# Patient Record
Sex: Female | Born: 1960 | Race: White | Hispanic: No | Marital: Married
Health system: Southern US, Community
[De-identification: ages and names within clinical notes are randomized; demographics above are authoritative.]

## PROBLEM LIST (undated history)

## (undated) DIAGNOSIS — F419 Anxiety disorder, unspecified: Secondary | ICD-10-CM

## (undated) DIAGNOSIS — I10 Essential (primary) hypertension: Secondary | ICD-10-CM

## (undated) DIAGNOSIS — E079 Disorder of thyroid, unspecified: Secondary | ICD-10-CM

## (undated) DIAGNOSIS — Z9889 Other specified postprocedural states: Secondary | ICD-10-CM

## (undated) DIAGNOSIS — K219 Gastro-esophageal reflux disease without esophagitis: Secondary | ICD-10-CM

## (undated) DIAGNOSIS — F32A Depression, unspecified: Secondary | ICD-10-CM

## (undated) DIAGNOSIS — F4310 Post-traumatic stress disorder, unspecified: Secondary | ICD-10-CM

## (undated) HISTORY — DX: Depression, unspecified: F32.A

## (undated) HISTORY — DX: Disorder of thyroid, unspecified: E07.9

## (undated) HISTORY — DX: Other specified postprocedural states: Z98.890

## (undated) HISTORY — DX: Anxiety disorder, unspecified: F41.9

## (undated) HISTORY — DX: Essential (primary) hypertension: I10

## (undated) HISTORY — PX: REDUCTION MAMMAPLASTY: SUR839

## (undated) HISTORY — DX: Gastro-esophageal reflux disease without esophagitis: K21.9

## (undated) HISTORY — DX: Post-traumatic stress disorder, unspecified: F43.10

## (undated) HISTORY — PX: CHOLECYSTECTOMY: SHX55

---

## 2000-01-25 ENCOUNTER — Other Ambulatory Visit: Admission: RE | Admit: 2000-01-25 | Discharge: 2000-01-25 | Payer: Self-pay | Admitting: *Deleted

## 2002-02-22 ENCOUNTER — Encounter: Payer: Self-pay | Admitting: Family Medicine

## 2002-02-22 ENCOUNTER — Encounter: Admission: RE | Admit: 2002-02-22 | Discharge: 2002-02-22 | Payer: Self-pay | Admitting: Family Medicine

## 2002-07-28 ENCOUNTER — Ambulatory Visit (HOSPITAL_COMMUNITY): Admission: RE | Admit: 2002-07-28 | Discharge: 2002-07-28 | Payer: Self-pay | Admitting: Family Medicine

## 2002-07-28 ENCOUNTER — Encounter: Payer: Self-pay | Admitting: Family Medicine

## 2004-03-08 ENCOUNTER — Other Ambulatory Visit: Admission: RE | Admit: 2004-03-08 | Discharge: 2004-03-08 | Payer: Self-pay | Admitting: Family Medicine

## 2004-04-02 ENCOUNTER — Encounter: Admission: RE | Admit: 2004-04-02 | Discharge: 2004-04-02 | Payer: Self-pay | Admitting: Family Medicine

## 2005-05-13 ENCOUNTER — Other Ambulatory Visit: Admission: RE | Admit: 2005-05-13 | Discharge: 2005-05-13 | Payer: Self-pay | Admitting: Family Medicine

## 2006-01-26 ENCOUNTER — Emergency Department (HOSPITAL_COMMUNITY): Admission: EM | Admit: 2006-01-26 | Discharge: 2006-01-26 | Payer: Self-pay | Admitting: Emergency Medicine

## 2006-06-28 ENCOUNTER — Other Ambulatory Visit: Admission: RE | Admit: 2006-06-28 | Discharge: 2006-06-28 | Payer: Self-pay | Admitting: Family Medicine

## 2007-08-07 ENCOUNTER — Encounter: Admission: RE | Admit: 2007-08-07 | Discharge: 2007-08-07 | Payer: Self-pay | Admitting: Family Medicine

## 2015-07-16 DIAGNOSIS — IMO0001 Reserved for inherently not codable concepts without codable children: Secondary | ICD-10-CM | POA: Insufficient documentation

## 2015-07-16 DIAGNOSIS — G4761 Periodic limb movement disorder: Secondary | ICD-10-CM | POA: Insufficient documentation

## 2015-07-16 DIAGNOSIS — G250 Essential tremor: Secondary | ICD-10-CM | POA: Insufficient documentation

## 2016-01-13 DIAGNOSIS — S069X9A Unspecified intracranial injury with loss of consciousness of unspecified duration, initial encounter: Secondary | ICD-10-CM | POA: Insufficient documentation

## 2016-01-13 DIAGNOSIS — S069XAA Unspecified intracranial injury with loss of consciousness status unknown, initial encounter: Secondary | ICD-10-CM | POA: Insufficient documentation

## 2017-01-26 DIAGNOSIS — I10 Essential (primary) hypertension: Secondary | ICD-10-CM | POA: Insufficient documentation

## 2017-06-02 DIAGNOSIS — F3341 Major depressive disorder, recurrent, in partial remission: Secondary | ICD-10-CM | POA: Insufficient documentation

## 2017-06-02 DIAGNOSIS — F339 Major depressive disorder, recurrent, unspecified: Secondary | ICD-10-CM | POA: Insufficient documentation

## 2017-06-02 DIAGNOSIS — G3184 Mild cognitive impairment, so stated: Secondary | ICD-10-CM | POA: Insufficient documentation

## 2017-07-27 DIAGNOSIS — A6000 Herpesviral infection of urogenital system, unspecified: Secondary | ICD-10-CM | POA: Insufficient documentation

## 2017-07-27 DIAGNOSIS — K219 Gastro-esophageal reflux disease without esophagitis: Secondary | ICD-10-CM | POA: Insufficient documentation

## 2020-06-10 ENCOUNTER — Other Ambulatory Visit: Payer: Self-pay

## 2020-06-10 ENCOUNTER — Ambulatory Visit: Payer: 59 | Admitting: Family Medicine

## 2020-06-10 ENCOUNTER — Encounter: Payer: Self-pay | Admitting: Family Medicine

## 2020-06-10 VITALS — BP 120/74 | HR 89 | Temp 98.0°F | Ht 62.5 in | Wt 143.8 lb

## 2020-06-10 DIAGNOSIS — F419 Anxiety disorder, unspecified: Secondary | ICD-10-CM

## 2020-06-10 DIAGNOSIS — Z2821 Immunization not carried out because of patient refusal: Secondary | ICD-10-CM | POA: Diagnosis not present

## 2020-06-10 DIAGNOSIS — Z7689 Persons encountering health services in other specified circumstances: Secondary | ICD-10-CM | POA: Diagnosis not present

## 2020-06-10 DIAGNOSIS — E039 Hypothyroidism, unspecified: Secondary | ICD-10-CM | POA: Diagnosis not present

## 2020-06-10 DIAGNOSIS — A6000 Herpesviral infection of urogenital system, unspecified: Secondary | ICD-10-CM

## 2020-06-10 DIAGNOSIS — I10 Essential (primary) hypertension: Secondary | ICD-10-CM

## 2020-06-10 DIAGNOSIS — F32A Depression, unspecified: Secondary | ICD-10-CM

## 2020-06-10 DIAGNOSIS — K649 Unspecified hemorrhoids: Secondary | ICD-10-CM

## 2020-06-10 DIAGNOSIS — K219 Gastro-esophageal reflux disease without esophagitis: Secondary | ICD-10-CM

## 2020-06-10 MED ORDER — VALACYCLOVIR HCL 1 G PO TABS: 1000.0000 mg | ORAL_TABLET | Freq: Every day | ORAL | 3 refills | Status: DC

## 2020-06-10 MED ORDER — BUPROPION HCL ER (XL) 450 MG PO TB24: 450.0000 mg | ORAL_TABLET | Freq: Every day | ORAL | 3 refills | Status: DC

## 2020-06-10 MED ORDER — VALSARTAN-HYDROCHLOROTHIAZIDE 80-12.5 MG PO TABS: 1.0000 | ORAL_TABLET | Freq: Every morning | ORAL | 3 refills | Status: DC

## 2020-06-10 MED ORDER — CITALOPRAM HYDROBROMIDE 20 MG PO TABS
20.0000 mg | ORAL_TABLET | Freq: Every day | ORAL | 3 refills | Status: DC
Start: 2020-06-10 — End: 2021-09-27

## 2020-06-10 MED ORDER — DEXILANT 60 MG PO CPDR: 60.0000 mg | DELAYED_RELEASE_CAPSULE | Freq: Every day | ORAL | 3 refills | Status: DC

## 2020-06-10 MED ORDER — SPIRONOLACTONE 25 MG PO TABS: 25.0000 mg | ORAL_TABLET | Freq: Every day | ORAL | 3 refills | Status: DC

## 2020-06-10 MED ORDER — CLONAZEPAM 0.5 MG PO TABS: 0.5000 mg | ORAL_TABLET | Freq: Two times a day (BID) | ORAL | 2 refills | Status: DC

## 2020-06-10 MED ORDER — LIOTHYRONINE SODIUM 5 MCG PO TABS: 10.0000 ug | ORAL_TABLET | Freq: Every day | ORAL | 3 refills | Status: DC

## 2020-06-10 NOTE — Progress Notes (Signed)
Erika Campbell is a 60 y.o. female  Chief Complaint  Patient presents with  . Establish Care    NP- establish care.      HPI: Erika Campbell is a 60 y.o. female here to establish care with our office. Her previous PCP was with [facility]. She is a retired P[employer] consultant.  She has HTN and is on valsartan/HCTZ 80-12.5mg  daily.  She has a h/o hypothyroidism and is on cytomel 10mg  (5mg  2 tabs) daily.  She has a h/o anxiety and depression and is on celexa 20mg  daily, wellbutrin XL 450mg  daily, and klonopin 0.5mg  qHS. She has GERD for which she takes dexilant 60mg  daily.   She follows at [facility] x 2-3 years and they Rx progesterone and estradiol pellets. They also Rx her thyroid medication.   Specialists: psychiatrist - ; she also sees a counselor  Last labs: 07/2019 Last PAP: 07/2019 - normal (done with PCP) Last mammo: 08/2019 Last colonoscopy: 2018 or so - due in 10 years  Med refills needed today: yes  She needs a referral to [facility] for hemorrhoids and ? Rectocele.   Mother at [place] in skilled nursing and has a-fib. Pt goes daily to care for her.   Past Medical History:  Diagnosis Date  . Anxiety   . Depression   . GERD (gastroesophageal reflux disease)   . Hypertension   . Thyroid disease     Past Surgical History:  Procedure Laterality Date  . CHOLECYSTECTOMY      Social History   Socioeconomic History  . Marital status: Unknown    Spouse name: Not on file  . Number of children: Not on file  . Years of education: Not on file  . Highest education level: Not on file  Occupational History  . Not on file  Tobacco Use  . Smoking status: Never Smoker  . Smokeless tobacco: Never Used  Vaping Use  . Vaping Use: Never used  Substance and Sexual Activity  . Alcohol use: Yes  . Drug use: Never  . Sexual activity: Yes  Other Topics Concern  . Not on file  Social History Narrative   ** Merged History Encounter **       Social Determinants of Health    Financial Resource Strain: Not on file  Food Insecurity: Not on file  Transportation Needs: Not on file  Physical Activity: Not on file  Stress: Not on file  Social Connections: Not on file  Intimate Partner Violence: Not on file    Family History  Problem Relation Age of Onset  . Heart disease Mother   . Stroke Mother   . Dementia Mother   . Heart disease Father   . Diabetes Father   . Hypertension Father   . Hyperlipidemia Father   . Cancer Father        bladder, prostrate  . Tremor Father      Immunization History  Administered Date(s) Administered  . PFIZER SARS-COV-2 Vaccination 07/29/2019, 08/26/2019    Outpatient Encounter Medications as of 06/10/2020  Medication Sig  . buPROPion (WELLBUTRIN XL) 150 MG 24 hr tablet   . buPROPion (WELLBUTRIN XL) 300 MG 24 hr tablet   . citalopram (CELEXA) 20 MG tablet Take by mouth.  . clonazePAM (KLONOPIN) 0.5 MG tablet Take 1 tablet by mouth at bedtime.  2019 dexlansoprazole (DEXILANT) 60 MG capsule Take 60 mg by mouth daily.  . Estradiol 10 MCG INST Place vaginally.  Killen liothyronine (CYTOMEL) 5 MCG tablet Take  2 tablets by mouth daily.  . progesterone (PROMETRIUM) 100 MG capsule Take by mouth.  . senna-docusate (SENOKOT-S) 8.6-50 MG tablet Take 1 tablet by mouth daily.  Marland Kitchen spironolactone (ALDACTONE) 25 MG tablet   . valACYclovir (VALTREX) 1000 MG tablet Take 1 tablet by mouth daily.  . valsartan-hydrochlorothiazide (DIOVAN-HCT) 80-12.5 MG tablet Take 1 tablet by mouth every morning.   No facility-administered encounter medications on file as of 06/10/2020.     ROS: Pertinent positives and negatives noted in HPI. Remainder of ROS non-contributory    Allergies  Allergen Reactions  . Lexapro [Escitalopram]     rages  . Lisinopril     nausea  . Mirapex [Pramipexole]     paranoid    BP 120/74   Pulse 89   Temp 98 F (36.7 C) (Temporal)   Ht 5' 2.5" (1.588 m)   Wt 143 lb 12.8 oz (65.2 kg)   SpO2 96%   BMI 25.88  kg/m   Physical Exam Constitutional:      General: She is not in acute distress.    Appearance: Normal appearance. She is not ill-appearing.  Pulmonary:     Effort: No respiratory distress.  Neurological:     Mental Status: She is alert and oriented to person, place, and time.  Psychiatric:        Mood and Affect: Mood normal.        Behavior: Behavior normal.      A/P:  1. Encounter to establish care with new doctor  2. Benign essential HTN - controlled, at goal Refill: - valsartan-hydrochlorothiazide (DIOVAN-HCT) 80-12.5 MG tablet; Take 1 tablet by mouth every morning.  Dispense: 90 tablet; Refill: 3 - spironolactone (ALDACTONE) 25 MG tablet; Take 1 tablet (25 mg total) by mouth daily.  Dispense: 90 tablet; Refill: 3  3. Hypothyroidism, unspecified type - TSH WNL in 07/2019 Refill: - liothyronine (CYTOMEL) 5 MCG tablet; Take 2 tablets (10 mcg total) by mouth daily.  Dispense: 180 tablet; Refill: 3  4. Influenza vaccination declined by patient  5. Herpes zoster vaccination declined  6. Anxiety and depression - controlled, follows with psych; PTSD d/t first husband's shooting death Refill: - clonazePAM (KLONOPIN) 0.5 MG tablet; Take 1 tablet (0.5 mg total) by mouth 2 (two) times daily.  Dispense: 60 tablet; Refill: 2 - buPROPion 450 MG TB24; Take 450 mg by mouth daily.  Dispense: 90 tablet; Refill: 3 - citalopram (CELEXA) 20 MG tablet; Take 1 tablet (20 mg total) by mouth daily.  Dispense: 90 tablet; Refill: 3  7. Gastroesophageal reflux disease, unspecified whether esophagitis present - stable Refill: - dexlansoprazole (DEXILANT) 60 MG capsule; Take 1 capsule (60 mg total) by mouth daily.  Dispense: 90 capsule; Refill: 3  8. Genital herpes simplex, unspecified site - stable, controlled Refill: - valACYclovir (VALTREX) 1000 MG tablet; Take 1 tablet (1,000 mg total) by mouth daily.  Dispense: 90 tablet; Refill: 3  9. Hemorrhoids, unspecified hemorrhoid type -  Ambulatory referral to General Surgery    This visit occurred during the SARS-CoV-2 public health emergency.  Safety protocols were in place, including screening questions prior to the visit, additional usage of staff PPE, and extensive cleaning of exam room while observing appropriate contact time as indicated for disinfecting solutions.

## 2020-06-10 NOTE — Patient Instructions (Signed)
[facility]: [email or web address]  [facility] [email or web address]  [email or web address]   [facility] [email or web address]  [email or web address]  

## 2020-06-15 ENCOUNTER — Encounter: Payer: Self-pay | Admitting: Family Medicine

## 2020-06-17 ENCOUNTER — Encounter: Payer: Self-pay | Admitting: Family Medicine

## 2020-06-17 ENCOUNTER — Telehealth: Payer: Self-pay

## 2020-06-17 NOTE — Telephone Encounter (Signed)
PA forms for both the Bupropion and Dexilant filled out, singed, and faxed to [employer] @ [phone]..  Waiting on response.  Dm/cma

## 2020-06-18 NOTE — Telephone Encounter (Signed)
PA for the Dexlansoprazole is approved from 06/17/20 - 06/16/21. Patient notified VIA my chart message. Dm/cma

## 2020-07-07 ENCOUNTER — Encounter: Payer: Self-pay | Admitting: Family Medicine

## 2020-07-25 ENCOUNTER — Encounter: Payer: Self-pay | Admitting: Family Medicine

## 2020-07-25 DIAGNOSIS — F32A Depression, unspecified: Secondary | ICD-10-CM

## 2020-07-25 DIAGNOSIS — F419 Anxiety disorder, unspecified: Secondary | ICD-10-CM

## 2020-07-29 MED ORDER — CLONAZEPAM 0.5 MG PO TABS: 0.5000 mg | ORAL_TABLET | Freq: Two times a day (BID) | ORAL | 2 refills | Status: DC

## 2020-08-18 ENCOUNTER — Encounter: Payer: Self-pay | Admitting: Family Medicine

## 2020-08-18 DIAGNOSIS — F32A Depression, unspecified: Secondary | ICD-10-CM

## 2020-08-19 MED ORDER — BUPROPION HCL ER (XL) 300 MG PO TB24: 300.0000 mg | ORAL_TABLET | Freq: Every day | ORAL | 1 refills | Status: DC

## 2020-08-19 MED ORDER — BUPROPION HCL ER (XL) 150 MG PO TB24: 150.0000 mg | ORAL_TABLET | Freq: Every day | ORAL | 1 refills | Status: DC

## 2020-10-06 ENCOUNTER — Other Ambulatory Visit: Payer: Self-pay

## 2020-10-07 ENCOUNTER — Ambulatory Visit (INDEPENDENT_AMBULATORY_CARE_PROVIDER_SITE_OTHER): Payer: 59 | Admitting: Family Medicine

## 2020-10-07 ENCOUNTER — Encounter: Payer: Self-pay | Admitting: Family Medicine

## 2020-10-07 VITALS — BP 100/90 | HR 89 | Temp 98.2°F | Ht 62.75 in | Wt 148.8 lb

## 2020-10-07 DIAGNOSIS — Z1321 Encounter for screening for nutritional disorder: Secondary | ICD-10-CM | POA: Diagnosis not present

## 2020-10-07 DIAGNOSIS — I10 Essential (primary) hypertension: Secondary | ICD-10-CM

## 2020-10-07 DIAGNOSIS — Z13220 Encounter for screening for lipoid disorders: Secondary | ICD-10-CM | POA: Diagnosis not present

## 2020-10-07 DIAGNOSIS — Z0000 Encounter for general adult medical examination without abnormal findings: Secondary | ICD-10-CM | POA: Diagnosis not present

## 2020-10-07 DIAGNOSIS — F32A Depression, unspecified: Secondary | ICD-10-CM

## 2020-10-07 DIAGNOSIS — F419 Anxiety disorder, unspecified: Secondary | ICD-10-CM

## 2020-10-07 DIAGNOSIS — M778 Other enthesopathies, not elsewhere classified: Secondary | ICD-10-CM

## 2020-10-07 DIAGNOSIS — E039 Hypothyroidism, unspecified: Secondary | ICD-10-CM

## 2020-10-07 DIAGNOSIS — Z1159 Encounter for screening for other viral diseases: Secondary | ICD-10-CM

## 2020-10-07 DIAGNOSIS — Z1231 Encounter for screening mammogram for malignant neoplasm of breast: Secondary | ICD-10-CM

## 2020-10-07 DIAGNOSIS — Z114 Encounter for screening for human immunodeficiency virus [HIV]: Secondary | ICD-10-CM

## 2020-10-07 LAB — BASIC METABOLIC PANEL
BUN: 9 mg/dL (ref 6–23)
CO2: 28 mEq/L (ref 19–32)
Calcium: 8.9 mg/dL (ref 8.4–10.5)
Chloride: 102 mEq/L (ref 96–112)
Creatinine, Ser: 0.78 mg/dL (ref 0.40–1.20)
GFR: 81.75 mL/min (ref >60.00)
Glucose, Bld: 109 mg/dL — ABNORMAL HIGH (ref 70–99)
Potassium: 3.8 mEq/L (ref 3.5–5.1)
Sodium: 138 mEq/L (ref 135–145)

## 2020-10-07 LAB — LIPID PANEL
Cholesterol: 201 mg/dL — ABNORMAL HIGH (ref 0–200)
HDL: 76.1 mg/dL (ref >39.00)
LDL Cholesterol: 107 mg/dL — ABNORMAL HIGH (ref 0–99)
NonHDL: 125.35
Total CHOL/HDL Ratio: 3
Triglycerides: 91 mg/dL (ref 0.0–149.0)
VLDL: 18.2 mg/dL (ref 0.0–40.0)

## 2020-10-07 LAB — CBC
HCT: 39.6 % (ref 36.0–46.0)
Hemoglobin: 13.6 g/dL (ref 12.0–15.0)
MCHC: 34.3 g/dL (ref 30.0–36.0)
MCV: 94.6 fl (ref 78.0–100.0)
Platelets: 333 10*3/uL (ref 150.0–400.0)
RBC: 4.18 Mil/uL (ref 3.87–5.11)
RDW: 13.1 % (ref 11.5–15.5)
WBC: 6.6 10*3/uL (ref 4.0–10.5)

## 2020-10-07 LAB — ALT: ALT: 27 U/L (ref 0–35)

## 2020-10-07 LAB — VITAMIN D 25 HYDROXY (VIT D DEFICIENCY, FRACTURES): VITD: 22.07 ng/mL — ABNORMAL LOW (ref 30.00–100.00)

## 2020-10-07 LAB — AST: AST: 16 U/L (ref 0–37)

## 2020-10-07 LAB — T4, FREE: Free T4: 0.73 ng/dL (ref 0.60–1.60)

## 2020-10-07 LAB — TSH: TSH: 2.27 u[IU]/mL (ref 0.35–4.50)

## 2020-10-07 NOTE — Progress Notes (Signed)
Erika Campbell is a 60 y.o. female  Chief Complaint  Patient presents with  . Annual Exam    Pt is fasting for lab work, pt not UTD on mammo, colonoscopy 5 yrs ago, unsure about pap smear.  Pt c/o lt thumb pain x 43months.    HPI: Erika Campbell is a 60 y.o. female patient seen today for annual CPE, fasting labs.   Last PAP: 03/2019 - normal, no h/o abnormal PAP Last mammo: due - last in 07/2018 Last colonoscopy: 09/06/2017 - due in 08/2027  Diet/Exercise: overall healthy, well-balanced; reasonable active and walks 10,000 steps per day Dental: UTD Vision: has appt schedule  Med refills needed today? none  Pt complains of Lt thumb pain x 2 mo. Pain at base of thumb. No pain at rest, pain with certain movement Gets worse as the day goes on. Difficulty touching thumb to 4th and 5th fingers. No swelling, ecchymosis.  Pt takes ibuprofen PRN which helps as needed.   Past Medical History:  Diagnosis Date  . Anxiety   . Depression   . GERD (gastroesophageal reflux disease)   . Hypertension   . Thyroid disease     Past Surgical History:  Procedure Laterality Date  . CHOLECYSTECTOMY      Social History   Socioeconomic History  . Marital status: Married    Spouse name: Not on file  . Number of children: Not on file  . Years of education: Not on file  . Highest education level: Not on file  Occupational History  . Not on file  Tobacco Use  . Smoking status: Never Smoker  . Smokeless tobacco: Never Used  Vaping Use  . Vaping Use: Never used  Substance and Sexual Activity  . Alcohol use: Yes  . Drug use: Never  . Sexual activity: Yes  Other Topics Concern  . Not on file  Social History Narrative   ** Merged History Encounter **       Social Determinants of Health   Financial Resource Strain: Not on file  Food Insecurity: Not on file  Transportation Needs: Not on file  Physical Activity: Not on file  Stress: Not on file  Social Connections: Not on file   Intimate Partner Violence: Not on file    Family History  Problem Relation Age of Onset  . Heart disease Mother   . Stroke Mother   . Dementia Mother   . Heart disease Father   . Diabetes Father   . Hypertension Father   . Hyperlipidemia Father   . Cancer Father        bladder, prostrate  . Tremor Father      Immunization History  Administered Date(s) Administered  . PFIZER(Purple Top)SARS-COV-2 Vaccination 07/29/2019, 08/26/2019    Outpatient Encounter Medications as of 10/07/2020  Medication Sig  . buPROPion (WELLBUTRIN XL) 150 MG 24 hr tablet Take 1 tablet (150 mg total) by mouth daily. Take with 300mg  tab for total of 450mg  daily  . buPROPion (WELLBUTRIN XL) 300 MG 24 hr tablet Take 1 tablet (300 mg total) by mouth daily. Take with 150mg  tab for total of 450mg  daily  . citalopram (CELEXA) 20 MG tablet Take 1 tablet (20 mg total) by mouth daily.  . clonazePAM (KLONOPIN) 0.5 MG tablet Take 1 tablet (0.5 mg total) by mouth 2 (two) times daily.  . Estradiol 10 MCG INST Place vaginally.  liothyronine (CYTOMEL) 5 MCG tablet Take 2 tablets (10 mcg total) by mouth daily.   progesterone (PROMETRIUM) 100 MG capsule Take by mouth.  . senna-docusate (SENOKOT-S) 8.6-50 MG tablet Take 1 tablet by mouth daily.  Marland Kitchen spironolactone (ALDACTONE) 25 MG tablet Take 1 tablet (25 mg total) by mouth daily.  . valACYclovir (VALTREX) 1000 MG tablet Take 1 tablet (1,000 mg total) by mouth daily.  . valsartan-hydrochlorothiazide (DIOVAN-HCT) 80-12.5 MG tablet Take 1 tablet by mouth every morning.   No facility-administered encounter medications on file as of 10/07/2020.     ROS: Gen: no fever, chills  Skin: no rash, itching ENT: no ear pain, ear drainage, nasal congestion, rhinorrhea, sinus pressure, sore throat Eyes: no blurry vision, double vision Resp: no cough, wheeze,SOB Breast: no breast tenderness, no nipple discharge, no breast masses CV: no CP, palpitations, LE edema,  GI: no  heartburn, n/v/d/c, abd pain GU: no dysuria, urgency, frequency, hematuria; no vaginal itching, odor, discharge MSK: as above in HPI Neuro: no dizziness, headache, weakness, vertigo Psych: no depression, anxiety, insomnia   Allergies  Allergen Reactions  . Lexapro [Escitalopram]     rages  . Lisinopril     nausea  . Mirapex [Pramipexole]     paranoid    BP 100/90 (BP Location: Left Arm, Patient Position: Sitting, Cuff Size: Normal)   Pulse 89   Temp 98.2 F (36.8 C) (Oral)   Ht 5' 2.75" (1.594 m)   Wt 148 lb 12.8 oz (67.5 kg)   SpO2 96%   BMI 26.57 kg/m   Wt Readings from Last 3 Encounters:  10/07/20 148 lb 12.8 oz (67.5 kg)  06/10/20 143 lb 12.8 oz (65.2 kg)   Temp Readings from Last 3 Encounters:  10/07/20 98.2 F (36.8 C) (Oral)  06/10/20 98 F (36.7 C) (Temporal)   BP Readings from Last 3 Encounters:  10/07/20 100/90  06/10/20 120/74   Pulse Readings from Last 3 Encounters:  10/07/20 89  06/10/20 89     Physical Exam Constitutional:      General: She is not in acute distress.    Appearance: She is well-developed.  HENT:     Head: Normocephalic and atraumatic.     Right Ear: Tympanic membrane and ear canal normal.     Left Ear: Tympanic membrane and ear canal normal.     Nose: Nose normal.     Mouth/Throat:     Mouth: Mucous membranes are moist.     Pharynx: Oropharynx is clear.  Eyes:     Conjunctiva/sclera: Conjunctivae normal.  Neck:     Thyroid: No thyromegaly.  Cardiovascular:     Rate and Rhythm: Normal rate and regular rhythm.     Heart sounds: Normal heart sounds. No murmur heard.   Pulmonary:     Effort: Pulmonary effort is normal. No respiratory distress.     Breath sounds: Normal breath sounds. No wheezing or rhonchi.  Abdominal:     General: Bowel sounds are normal. There is no distension.     Palpations: Abdomen is soft. There is no mass.     Tenderness: There is no abdominal tenderness.  Musculoskeletal:     Left hand:  Tenderness present. No swelling or bony tenderness. Normal range of motion. Normal strength. Normal sensation. Normal pulse.     Cervical back: Neck supple.     Right lower leg: No edema.     Left lower leg: No edema.     Comments: + TTP over medial aspect of base of Lt thumb extending down to wrist; pain with adduction and abduction of thumb  Lymphadenopathy:     Cervical: No cervical adenopathy.  Skin:    General: Skin is warm and dry.  Neurological:     Mental Status: She is alert and oriented to person, place, and time.     Motor: No abnormal muscle tone.     Coordination: Coordination normal.  Psychiatric:        Behavior: Behavior normal.    A/P:  1. Annual physical exam - discussed importance of regular CV exercise, healthy diet, adequate sleep - UTD on PAP, colonoscopy - due for mammo and referral placed today - UTD on dental and vision appt schedule - CBC - Basic metabolic panel - AST - ALT - next CPE in 1 year  2. Hypothyroidism, unspecified type - on cytomel daily - TSH - T4, free  3. Benign essential HTN - controlled - cont aldactone 25mg  daily, diovan hct 80-12.5mg  daily - CBC - Basic metabolic panel  4. Anxiety and depression - controlled - cont wellbutrin 450mg  daily, celexa 20mg  aily, klonopin 0.5mg  BID  5. Screening for lipid disorders - Lipid panel  6. Encounter for vitamin deficiency screening - VITAMIN D 25 Hydroxy (Vit-D Deficiency, Fractures)  7. Screening for HIV without presence of risk factors - HIV Antibody (routine testing w rflx)  8. Encounter for hepatitis C screening test for low risk patient - Hepatitis C Antibody  9. Screening mammogram for breast cancer - MM DIGITAL SCREENING BILATERAL; Future  10. Thumb tendonitis - symptoms x 2 mo - ibuprofen 400-600mg  BID-TID w food x 5-7 days - ice BID, exercises daily - brace x 1 week - f/u if symptoms worsen or do not improve in 2-3 wks   This visit occurred during the  SARS-CoV-2 public health emergency.  Safety protocols were in place, including screening questions prior to the visit, additional usage of staff PPE, and extensive cleaning of exam room while observing appropriate contact time as indicated for disinfecting solutions.

## 2020-10-07 NOTE — Patient Instructions (Signed)
Health Maintenance, Female Adopting a healthy lifestyle and getting preventive care are important in promoting health and wellness. Ask your health care provider about:  The right schedule for you to have regular tests and exams.  Things you can do on your own to prevent diseases and keep yourself healthy. What should I know about diet, weight, and exercise? Eat a healthy diet  Eat a diet that includes plenty of vegetables, fruits, low-fat dairy products, and lean protein.  Do not eat a lot of foods that are high in solid fats, added sugars, or sodium.   Maintain a healthy weight Body mass index (BMI) is used to identify weight problems. It estimates body fat based on height and weight. Your health care provider can help determine your BMI and help you achieve or maintain a healthy weight. Get regular exercise Get regular exercise. This is one of the most important things you can do for your health. Most adults should:  Exercise for at least 150 minutes each week. The exercise should increase your heart rate and make you sweat (moderate-intensity exercise).  Do strengthening exercises at least twice a week. This is in addition to the moderate-intensity exercise.  Spend less time sitting. Even light physical activity can be beneficial. Watch cholesterol and blood lipids Have your blood tested for lipids and cholesterol at 62 years of age, then have this test every 5 years. Have your cholesterol levels checked more often if:  Your lipid or cholesterol levels are high.  You are older than 62 years of age.  You are at high risk for heart disease. What should I know about cancer screening? Depending on your health history and family history, you may need to have cancer screening at various ages. This may include screening for:  Breast cancer.  Cervical cancer.  Colorectal cancer.  Skin cancer.  Lung cancer. What should I know about heart disease, diabetes, and high blood  pressure? Blood pressure and heart disease  High blood pressure causes heart disease and increases the risk of stroke. This is more likely to develop in people who have high blood pressure readings, are of African descent, or are overweight.  Have your blood pressure checked: ? Every 3-5 years if you are 18-39 years of age. ? Every year if you are 40 years old or older. Diabetes Have regular diabetes screenings. This checks your fasting blood sugar level. Have the screening done:  Once every three years after age 40 if you are at a normal weight and have a low risk for diabetes.  More often and at a younger age if you are overweight or have a high risk for diabetes. What should I know about preventing infection? Hepatitis B If you have a higher risk for hepatitis B, you should be screened for this virus. Talk with your health care provider to find out if you are at risk for hepatitis B infection. Hepatitis C Testing is recommended for:  Everyone born from 1945 through 1965.  Anyone with known risk factors for hepatitis C. Sexually transmitted infections (STIs)  Get screened for STIs, including gonorrhea and chlamydia, if: ? You are sexually active and are younger than 62 years of age. ? You are older than 62 years of age and your health care provider tells you that you are at risk for this type of infection. ? Your sexual activity has changed since you were last screened, and you are at increased risk for chlamydia or gonorrhea. Ask your health care provider   if you are at risk.  Ask your health care provider about whether you are at high risk for HIV. Your health care provider may recommend a prescription medicine to help prevent HIV infection. If you choose to take medicine to prevent HIV, you should first get tested for HIV. You should then be tested every 3 months for as long as you are taking the medicine. Pregnancy  If you are about to stop having your period (premenopausal) and  you may become pregnant, seek counseling before you get pregnant.  Take 400 to 800 micrograms (mcg) of folic acid every day if you become pregnant.  Ask for birth control (contraception) if you want to prevent pregnancy. Osteoporosis and menopause Osteoporosis is a disease in which the bones lose minerals and strength with aging. This can result in bone fractures. If you are 65 years old or older, or if you are at risk for osteoporosis and fractures, ask your health care provider if you should:  Be screened for bone loss.  Take a calcium or vitamin D supplement to lower your risk of fractures.  Be given hormone replacement therapy (HRT) to treat symptoms of menopause. Follow these instructions at home: Lifestyle  Do not use any products that contain nicotine or tobacco, such as cigarettes, e-cigarettes, and chewing tobacco. If you need help quitting, ask your health care provider.  Do not use street drugs.  Do not share needles.  Ask your health care provider for help if you need support or information about quitting drugs. Alcohol use  Do not drink alcohol if: ? Your health care provider tells you not to drink. ? You are pregnant, may be pregnant, or are planning to become pregnant.  If you drink alcohol: ? Limit how much you use to 0-1 drink a day. ? Limit intake if you are breastfeeding.  Be aware of how much alcohol is in your drink. In the [place] one drink equals one 12 oz bottle of beer (355 mL), one 5 oz glass of wine (148 mL), or one 1 oz glass of hard liquor (44 mL). General instructions  Schedule regular health, dental, and eye exams.  Stay current with your vaccines.  Tell your health care provider if: ? You often feel depressed. ? You have ever been abused or do not feel safe at home. Summary  Adopting a healthy lifestyle and getting preventive care are important in promoting health and wellness.  Follow your health care provider's instructions about healthy  diet, exercising, and getting tested or screened for diseases.  Follow your health care provider's instructions on monitoring your cholesterol and blood pressure. This information is not intended to replace advice given to you by your health care provider. Make sure you discuss any questions you have with your health care provider. Document Revised: 05/09/2018 Document Reviewed: 05/09/2018 Elsevier Patient Education  2021 [employer]  

## 2020-10-08 LAB — HEPATITIS C ANTIBODY
Hepatitis C Ab: NONREACTIVE
SIGNAL TO CUT-OFF: 0.01 (ref <1.00)

## 2020-10-08 LAB — HIV ANTIBODY (ROUTINE TESTING W REFLEX): HIV 1&2 Ab, 4th Generation: NONREACTIVE

## 2020-10-15 ENCOUNTER — Encounter: Payer: Self-pay | Admitting: Family Medicine

## 2020-10-15 ENCOUNTER — Other Ambulatory Visit: Payer: Self-pay | Admitting: Family Medicine

## 2020-10-15 DIAGNOSIS — E559 Vitamin D deficiency, unspecified: Secondary | ICD-10-CM

## 2020-10-15 MED ORDER — VITAMIN D (ERGOCALCIFEROL) 1.25 MG (50000 UNIT) PO CAPS
50000.0000 [IU] | ORAL_CAPSULE | ORAL | 2 refills | Status: DC
Start: 2020-10-15 — End: 2020-10-20

## 2020-10-20 ENCOUNTER — Other Ambulatory Visit: Payer: Self-pay | Admitting: Family Medicine

## 2020-10-20 DIAGNOSIS — E559 Vitamin D deficiency, unspecified: Secondary | ICD-10-CM

## 2020-10-31 ENCOUNTER — Encounter: Payer: Self-pay | Admitting: Family Medicine

## 2020-11-26 ENCOUNTER — Encounter: Payer: Self-pay | Admitting: Family Medicine

## 2020-11-26 DIAGNOSIS — F32A Depression, unspecified: Secondary | ICD-10-CM

## 2020-11-27 MED ORDER — CLONAZEPAM 0.5 MG PO TABS
0.5000 mg | ORAL_TABLET | Freq: Two times a day (BID) | ORAL | 2 refills | Status: DC
Start: 2020-11-27 — End: 2021-03-05

## 2020-12-10 ENCOUNTER — Other Ambulatory Visit: Payer: Self-pay | Admitting: Family Medicine

## 2020-12-10 DIAGNOSIS — F419 Anxiety disorder, unspecified: Secondary | ICD-10-CM

## 2020-12-10 DIAGNOSIS — F32A Depression, unspecified: Secondary | ICD-10-CM

## 2020-12-21 ENCOUNTER — Other Ambulatory Visit: Payer: Self-pay

## 2020-12-21 ENCOUNTER — Ambulatory Visit
Admission: RE | Admit: 2020-12-21 | Discharge: 2020-12-21 | Disposition: A | Discharge status: Home / Self Care | Payer: 59 | Source: Ambulatory Visit | Attending: Family Medicine | Admitting: Family Medicine

## 2020-12-21 DIAGNOSIS — Z1231 Encounter for screening mammogram for malignant neoplasm of breast: Secondary | ICD-10-CM

## 2020-12-24 ENCOUNTER — Other Ambulatory Visit: Payer: Self-pay | Admitting: Family Medicine

## 2020-12-24 DIAGNOSIS — R928 Other abnormal and inconclusive findings on diagnostic imaging of breast: Secondary | ICD-10-CM

## 2021-01-11 ENCOUNTER — Other Ambulatory Visit: Payer: Self-pay | Admitting: Nurse Practitioner

## 2021-01-11 ENCOUNTER — Ambulatory Visit
Admission: RE | Admit: 2021-01-11 | Discharge: 2021-01-11 | Disposition: A | Discharge status: Home / Self Care | Payer: 59 | Source: Ambulatory Visit | Attending: Family Medicine | Admitting: Family Medicine

## 2021-01-11 ENCOUNTER — Other Ambulatory Visit: Payer: Self-pay | Admitting: Family Medicine

## 2021-01-11 ENCOUNTER — Ambulatory Visit: Payer: 59

## 2021-01-11 ENCOUNTER — Other Ambulatory Visit: Payer: Self-pay

## 2021-01-11 DIAGNOSIS — R928 Other abnormal and inconclusive findings on diagnostic imaging of breast: Secondary | ICD-10-CM

## 2021-01-11 DIAGNOSIS — N6310 Unspecified lump in the right breast, unspecified quadrant: Secondary | ICD-10-CM

## 2021-01-13 ENCOUNTER — Encounter: Payer: Self-pay | Admitting: Family Medicine

## 2021-01-13 ENCOUNTER — Ambulatory Visit (INDEPENDENT_AMBULATORY_CARE_PROVIDER_SITE_OTHER): Payer: 59 | Admitting: Family Medicine

## 2021-01-13 ENCOUNTER — Other Ambulatory Visit: Payer: Self-pay

## 2021-01-13 VITALS — BP 128/78 | HR 91 | Temp 97.2°F | Resp 16 | Wt 148.6 lb

## 2021-01-13 DIAGNOSIS — R928 Other abnormal and inconclusive findings on diagnostic imaging of breast: Secondary | ICD-10-CM

## 2021-01-13 NOTE — Progress Notes (Signed)
[facility] [address] [place] [address] Dept: [phone] Dept Fax: [phone]  Office Visit  Subjective:    Patient ID: Erika Campbell, female    DOB: 1960/10/05, 60 y.o..   MRN: [id number]  Chief Complaint  Patient presents with   Acute Visit    Pt is here to get sign off on core biopsy of right breast.    History of Present Illness:  Patient is in today for a referral for a core biopsy of her right breast. Erika Campbell had a recent mammogram and follow-up ultrasound which revealed a suspicious mass. The radiologist has recommend an ultrasound-guided right breast core biopsy. Erika Campbell has a history of a prior right breast reduction (2012). She also notes she had a maternal aunt with breast cancer.  Past Medical History: Patient Active Problem List   Diagnosis Date Noted   Vitamin D deficiency 10/15/2020   Genital herpes simplex 07/27/2017   Gastroesophageal reflux disease without esophagitis 07/27/2017   Mild neurocognitive disorder 06/02/2017   Episode of recurrent major depressive disorder (HCC) 06/02/2017   Benign essential hypertension 01/26/2017   Traumatic brain injury (HCC) 01/13/2016   Shaking spells 07/16/2015   Periodic limb movement sleep disorder 07/16/2015   Benign essential tremor 07/16/2015   Past Surgical History:  Procedure Laterality Date   CHOLECYSTECTOMY     Family History  Problem Relation Age of Onset   Heart disease Mother    Stroke Mother    Dementia Mother    Heart disease Father    Diabetes Father    Hypertension Father    Hyperlipidemia Father    Cancer Father        bladder, prostrate   Tremor Father    Outpatient Medications Prior to Visit  Medication Sig Dispense Refill   buPROPion (WELLBUTRIN XL) 150 MG 24 hr tablet TAKE 1 TABLET BY MOUTH DAILY. TAKE WITH 300MG  TAB FOR TOTAL OF 450MG  DAILY. 90 tablet 1   buPROPion (WELLBUTRIN XL) 300 MG 24 hr tablet TAKE 1  TABLET BY MOUTH DAILY. TAKE WITH 150MG  TABLET FOR TOTAL OF 450MG  DAILY. 90 tablet 1   citalopram (CELEXA) 20 MG tablet Take 1 tablet (20 mg total) by mouth daily. 90 tablet 3   clonazePAM (KLONOPIN) 0.5 MG tablet Take 1 tablet (0.5 mg total) by mouth 2 (two) times daily. 60 tablet 2   liothyronine (CYTOMEL) 5 MCG tablet Take 2 tablets (10 mcg total) by mouth daily. 180 tablet 3   progesterone (PROMETRIUM) 100 MG capsule Take by mouth.     senna-docusate (SENOKOT-S) 8.6-50 MG tablet Take 1 tablet by mouth daily.     spironolactone (ALDACTONE) 25 MG tablet Take 1 tablet (25 mg total) by mouth daily. 90 tablet 3   valACYclovir (VALTREX) 1000 MG tablet Take 1 tablet (1,000 mg total) by mouth daily. 90 tablet 3   valsartan-hydrochlorothiazide (DIOVAN-HCT) 80-12.5 MG tablet Take 1 tablet by mouth every morning. 90 tablet 3   Vitamin D, Ergocalciferol, (DRISDOL) 1.25 MG (50000 UNIT) CAPS capsule TAKE 1 CAPSULE BY MOUTH ONCE EVERY 7 DAYS. 12 capsule 0   Estradiol 10 MCG INST Place vaginally. (Patient not taking: Reported on 01/13/2021)     No facility-administered medications prior to visit.   Allergies  Allergen Reactions   Lexapro [Escitalopram]     rages   Lisinopril     nausea   Mirapex [Pramipexole]     paranoid   Propranolol Other (See Comments)  Pt states she did not like the way it affected her body.     Objective:   Today's Vitals   01/13/21 1505  BP: 128/78  Pulse: 91  Resp: 16  Temp: (!) 97.2 F (36.2 C)  TempSrc: Temporal  SpO2: 98%  Weight: 148 lb 9.6 oz (67.4 kg)   Body mass index is 26.53 kg/m.   General: Well developed, well nourished. No acute distress. Psych: Alert and oriented x3. Normal mood and affect.  Health Maintenance Due  Topic Date Due   Zoster Vaccines- Shingrix (1 of 2) Never done   COVID-19 Vaccine (3 - Booster for Pfizer series) 01/26/2020   INFLUENZA VACCINE  12/28/2020   Lab Results Screening Mammogram (12/21/2020) IMPRESSION: Further  evaluation is suggested for possible distortion in the right breast. Further evaluation is suggested for possible distortion in the left breast.  BI-RADS CATEGORY  0: Incomplete. Need additional imaging evaluation and/or prior mammograms for comparison.  Diagnostic Mammogram/Right Breast Ultrasound (01/11/2021) IMPRESSION: 1. There is an indeterminate 1.1 cm mass in the right breast at 9 o'clock, 2 cm from the nipple.   2.  No evidence of right axillary lymphadenopathy.   3. The bilateral breast distortions are consistent with the patient's history of reduction mammoplasty.   BI-RADS CATEGORY  4: Suspicious.     Assessment & Plan:   1. Abnormal mammogram of right breast I agree with the plan for proceeding with an ultrasound-guided core biopsy of the right breast. Further management and follow-up will be based off of these results.  - [place] RT BREAST BX W LOC DEV 1ST LESION IMG BX SPEC US GUIDE; Future  Loyola Mast, MD

## 2021-01-20 ENCOUNTER — Other Ambulatory Visit: Payer: Self-pay | Admitting: Family Medicine

## 2021-01-20 DIAGNOSIS — R928 Other abnormal and inconclusive findings on diagnostic imaging of breast: Secondary | ICD-10-CM

## 2021-01-26 ENCOUNTER — Ambulatory Visit
Admission: RE | Admit: 2021-01-26 | Discharge: 2021-01-26 | Disposition: A | Discharge status: Home / Self Care | Payer: 59 | Source: Ambulatory Visit | Attending: Family Medicine | Admitting: Family Medicine

## 2021-01-26 ENCOUNTER — Other Ambulatory Visit: Payer: Self-pay

## 2021-01-26 DIAGNOSIS — R928 Other abnormal and inconclusive findings on diagnostic imaging of breast: Secondary | ICD-10-CM

## 2021-03-04 ENCOUNTER — Other Ambulatory Visit: Payer: Self-pay

## 2021-03-04 DIAGNOSIS — E559 Vitamin D deficiency, unspecified: Secondary | ICD-10-CM

## 2021-03-04 NOTE — Telephone Encounter (Signed)
Refill request for: Vitamin D 08811 IU LR 10/20/20, #12, 0 rf LOV 10/07/20  (Dr Barron Alvine) FOV 04/09/21  (Dr Veto Kemps)  Please review and advise.  Thanks. Dm/cma

## 2021-03-05 ENCOUNTER — Other Ambulatory Visit: Payer: Self-pay

## 2021-03-05 DIAGNOSIS — F419 Anxiety disorder, unspecified: Secondary | ICD-10-CM

## 2021-03-05 DIAGNOSIS — F32A Depression, unspecified: Secondary | ICD-10-CM

## 2021-03-05 MED ORDER — VITAMIN D (ERGOCALCIFEROL) 1.25 MG (50000 UNIT) PO CAPS: ORAL_CAPSULE | ORAL | 0 refills | Status: DC

## 2021-03-05 MED ORDER — CLONAZEPAM 0.5 MG PO TABS: 0.5000 mg | ORAL_TABLET | Freq: Two times a day (BID) | ORAL | 3 refills | Status: DC

## 2021-03-05 NOTE — Telephone Encounter (Signed)
Refill request for :  Clonazepam 0.5 mg LR 11/27/20, #60, 2 rfs LOV 01/13/21 FOV  04/09/21  Please review and advise.  Thanks. Dm/cma

## 2021-03-17 ENCOUNTER — Ambulatory Visit
Admission: EM | Admit: 2021-03-17 | Discharge: 2021-03-17 | Disposition: A | Discharge status: Home / Self Care | Payer: 59 | Attending: Emergency Medicine | Admitting: Emergency Medicine

## 2021-03-17 ENCOUNTER — Other Ambulatory Visit: Payer: Self-pay

## 2021-03-17 ENCOUNTER — Encounter: Payer: Self-pay | Admitting: Emergency Medicine

## 2021-03-17 DIAGNOSIS — Z7184 Encounter for health counseling related to travel: Secondary | ICD-10-CM

## 2021-03-17 DIAGNOSIS — R0981 Nasal congestion: Secondary | ICD-10-CM

## 2021-03-17 NOTE — ED Provider Notes (Signed)
Patient left without being seen, patient was wanting a rapid COVID test and we could not provide that for her today.   Theadora Rama Scales, PA-C 03/17/21 1050

## 2021-03-17 NOTE — ED Triage Notes (Signed)
Pt c/o nasal congestion and chills that's started last night. States she has a cruise planned for this week and wants to make sure she is not contagious.

## 2021-03-17 NOTE — ED Notes (Signed)
Patient states she does not want the PCR covid/ flu test.

## 2021-04-09 ENCOUNTER — Other Ambulatory Visit: Payer: Self-pay

## 2021-04-09 ENCOUNTER — Ambulatory Visit (INDEPENDENT_AMBULATORY_CARE_PROVIDER_SITE_OTHER): Payer: 59 | Admitting: Family Medicine

## 2021-04-09 ENCOUNTER — Encounter: Payer: Self-pay | Admitting: Family Medicine

## 2021-04-09 VITALS — BP 114/68 | HR 82 | Temp 97.4°F | Ht 62.75 in | Wt 147.0 lb

## 2021-04-09 DIAGNOSIS — I10 Essential (primary) hypertension: Secondary | ICD-10-CM | POA: Diagnosis not present

## 2021-04-09 DIAGNOSIS — E559 Vitamin D deficiency, unspecified: Secondary | ICD-10-CM

## 2021-04-09 DIAGNOSIS — E038 Other specified hypothyroidism: Secondary | ICD-10-CM

## 2021-04-09 DIAGNOSIS — E782 Mixed hyperlipidemia: Secondary | ICD-10-CM

## 2021-04-09 DIAGNOSIS — F339 Major depressive disorder, recurrent, unspecified: Secondary | ICD-10-CM

## 2021-04-09 DIAGNOSIS — R7303 Prediabetes: Secondary | ICD-10-CM

## 2021-04-09 NOTE — Progress Notes (Signed)
[facility] [address] [place] [address] Dept: [phone] Dept Fax: [phone]  Transfer of Care Office Visit  Subjective:    Patient ID: Erika Campbell, female    DOB: [birth year], 60 y.o..   MRN: [id number]  Chief Complaint  Patient presents with   Establish Care    [facility]- establish care. Declines flu shot.      History of Present Illness:  Patient is in today to establish care. Erika Campbell was born in [place], [place]. Her family moved to [place] when she was 60 yo and then to [place] when she was 60 yo. She has been married to her 2nd husband for 13 years. Her 1st husband was murdered. She has no children. 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 She denies tobacco or drug use and only drinks rarely.  Erika Campbell has a history significant depression issues. She notes 2 prior suicide attempts and previous inpatient psychiatric care. She notes her psychologist retired recently and she is no longer under the care of a psychiatrist. She is currently managed on Wellbutrin 450 mg (300 + 150) daily, citalopram 20 mg daily and Klonipin 0.5 mg bid. She has been stable on this mix for quite some time.  Erika Campbell has a history of GERD. She notes Dexilant has worked quite well for her for this, but that she has run into insurance issues about obtaining this. She does want to resume Dexilant when her insurance changes in January.  Erika Campbell notes she goes to [facility]. She is being managed for menopausal symptoms with a estradiol implant and oral progesterone. She was unsure about her need for Cytomel, though I see a diagnosis of subclinical hypothyroidism on her chart. She wonders about a trial off of this.   Erika Campbell has a history of hypertension. She is managed on valsartan/HCTZ and spironolactone. She is interested in stopping the spironolactone.  Erika Campbell has an abnormal  mammogram this summer. She underwent a breast biopsy that showed benign fibrocystic changes.  Past Medical History: Patient Active Problem List   Diagnosis Date Noted   Mixed hyperlipidemia 04/09/2021   Prediabetes 04/09/2021   Subclinical hypothyroidism 04/09/2021   Essential hypertension 04/09/2021   Vitamin D deficiency 10/15/2020   Genital herpes simplex 07/27/2017   Gastroesophageal reflux disease without esophagitis 07/27/2017   Mild neurocognitive disorder 06/02/2017   Episode of recurrent major depressive disorder (HCC) 06/02/2017   Traumatic brain injury 01/13/2016   Shaking spells 07/16/2015   Periodic limb movement sleep disorder 07/16/2015   Benign essential tremor 07/16/2015   Past Surgical History:  Procedure Laterality Date   CHOLECYSTECTOMY     Family History  Problem Relation Age of Onset   Heart disease Mother    Stroke Mother    Dementia Mother    Heart disease Father    Diabetes Father    Hypertension Father    Hyperlipidemia Father    Cancer Father        bladder, prostrate   Tremor Father    Cancer Brother        Melanoma   Alcohol abuse Brother    Cancer Maternal Grandmother        Brain   Outpatient Medications Prior to Visit  Medication Sig Dispense Refill   buPROPion (WELLBUTRIN XL) 150 MG 24 hr tablet TAKE 1 TABLET BY MOUTH DAILY. TAKE WITH 300MG  TAB FOR TOTAL OF 450MG   DAILY. 90 tablet 1   buPROPion (WELLBUTRIN XL) 300 MG 24 hr tablet TAKE 1 TABLET BY MOUTH DAILY. TAKE WITH 150MG  TABLET FOR TOTAL OF 450MG  DAILY. 90 tablet 1   citalopram (CELEXA) 20 MG tablet Take 1 tablet (20 mg total) by mouth daily. 90 tablet 3   clonazePAM (KLONOPIN) 0.5 MG tablet Take 1 tablet (0.5 mg total) by mouth 2 (two) times daily. 60 tablet 3   dexlansoprazole (DEXILANT) 60 MG capsule 1 capsule     Estradiol 10 MCG INST Place vaginally.     liothyronine (CYTOMEL) 5 MCG tablet Take 2 tablets (10 mcg total) by mouth daily. 180 tablet 3   Multiple Vitamins-Minerals  (HAIR SKIN AND NAILS FORMULA PO) Take by mouth.     Multiple Vitamins-Minerals (MULTIVITAMIN WITH MINERALS) tablet Take 1 tablet by mouth daily.     progesterone (PROMETRIUM) 100 MG capsule Take by mouth.     senna-docusate (SENOKOT-S) 8.6-50 MG tablet Take 1 tablet by mouth daily.     spironolactone (ALDACTONE) 25 MG tablet Take 1 tablet (25 mg total) by mouth daily. 90 tablet 3   valACYclovir (VALTREX) 1000 MG tablet Take 1 tablet (1,000 mg total) by mouth daily. 90 tablet 3   valsartan-hydrochlorothiazide (DIOVAN-HCT) 80-12.5 MG tablet Take 1 tablet by mouth every morning. 90 tablet 3   Vitamin D, Ergocalciferol, (DRISDOL) 1.25 MG (50000 UNIT) CAPS capsule TAKE 1 CAPSULE BY MOUTH ONCE EVERY 7 DAYS. 12 capsule 0   No facility-administered medications prior to visit.   Allergies  Allergen Reactions   Lexapro [Escitalopram]     rages   Lisinopril     nausea   Mirapex [Pramipexole]     paranoid   Propranolol Other (See Comments)    Pt states she did not like the way it affected her body.      Objective:   Today's Vitals   04/09/21 1503  BP: 114/68  Pulse: 82  Temp: (!) 97.4 F (36.3 C)  TempSrc: Temporal  SpO2: 98%  Weight: 147 lb (66.7 kg)  Height: 5' 2.75" (1.594 m)   Body mass index is 26.25 kg/m.   General: Well developed, well nourished. No acute distress. Psych: Alert and oriented. Normal mood and affect.  Health Maintenance Due  Topic Date Due   Zoster Vaccines- Shingrix (1 of 2) Never done   COVID-19 Vaccine (3 - Booster for Pfizer series) 10/21/2019   INFLUENZA VACCINE  Never done     Assessment & Plan:   1. Essential hypertension Erika Campbell's blood pressure is at goal. We will try stopping her Aldactone and reassess her BP in 3 months.  2. Subclinical hypothyroidism Erika Campbell would like to get off of Cytomel. We discussed her stopping use of her hair/skin/nails supplement (which likely contains biotin). We will then check her TSH to have a baseline. I  will have her stop her Cytomel and we will reassess her TSH and T4 in 3 months.  - TSH; Future  3. Vitamin D deficiency Erika Campbell has been on Vit D replacement. She is due for reassessment of her Vitamin D level.  - VITAMIN D 25 Hydroxy (Vit-D Deficiency, Fractures); Future  4. Episode of recurrent major depressive disorder, unspecified depression episode severity (HCC) We will continue her current regimen of bupropion, citalopram, and clonazepam.  5. Mixed hyperlipidemia The record indicates prior hyperlipidemia. We will reassess her fasting lipids. - Lipid panel; Future  6. Prediabetes The record indicates prior prediabetes. I will recheck her A1c.  -  Hemoglobin A1c; Future  Loyola Mast, MD

## 2021-04-16 ENCOUNTER — Other Ambulatory Visit: Payer: Self-pay

## 2021-04-16 ENCOUNTER — Other Ambulatory Visit (INDEPENDENT_AMBULATORY_CARE_PROVIDER_SITE_OTHER): Payer: 59

## 2021-04-16 DIAGNOSIS — E038 Other specified hypothyroidism: Secondary | ICD-10-CM

## 2021-04-16 DIAGNOSIS — R7303 Prediabetes: Secondary | ICD-10-CM

## 2021-04-16 DIAGNOSIS — E559 Vitamin D deficiency, unspecified: Secondary | ICD-10-CM

## 2021-04-16 DIAGNOSIS — E782 Mixed hyperlipidemia: Secondary | ICD-10-CM

## 2021-04-16 LAB — LIPID PANEL
Cholesterol: 180 mg/dL (ref 0–200)
HDL: 73 mg/dL (ref >39.00)
LDL Cholesterol: 95 mg/dL (ref 0–99)
NonHDL: 107.37
Total CHOL/HDL Ratio: 2
Triglycerides: 62 mg/dL (ref 0.0–149.0)
VLDL: 12.4 mg/dL (ref 0.0–40.0)

## 2021-04-16 LAB — HEMOGLOBIN A1C: Hgb A1c MFr Bld: 5.1 % (ref 4.6–6.5)

## 2021-04-16 LAB — TSH: TSH: 1.87 u[IU]/mL (ref 0.35–5.50)

## 2021-04-16 LAB — VITAMIN D 25 HYDROXY (VIT D DEFICIENCY, FRACTURES): VITD: 44.43 ng/mL (ref 30.00–100.00)

## 2021-04-16 NOTE — Progress Notes (Signed)
Pt is here for lab work. Blood was drawn from the left median cubital vein. Pt tolerated blood draw well. Pt instructed to leave bandage on for 3-5 mins.

## 2021-06-18 ENCOUNTER — Other Ambulatory Visit: Payer: Self-pay

## 2021-06-18 DIAGNOSIS — I10 Essential (primary) hypertension: Secondary | ICD-10-CM

## 2021-06-18 MED ORDER — VALSARTAN-HYDROCHLOROTHIAZIDE 80-12.5 MG PO TABS: 1.0000 | ORAL_TABLET | Freq: Every morning | ORAL | 3 refills | Status: DC

## 2021-06-18 MED ORDER — SPIRONOLACTONE 25 MG PO TABS: 25.0000 mg | ORAL_TABLET | Freq: Every day | ORAL | 3 refills | Status: DC

## 2021-06-18 NOTE — Telephone Encounter (Signed)
Received refill request for:  Spironolactone 25 mg  LR 06/10/20, #90, 13 rf  Losartan/HCTZ 80/12.5 mg LR 06/10/20, #90, 3 rf LOV 04/09/21 FOV 07/12/21  Please review and advise.  Thanks. Dm/cma

## 2021-07-12 ENCOUNTER — Other Ambulatory Visit: Payer: Self-pay

## 2021-07-12 ENCOUNTER — Ambulatory Visit (INDEPENDENT_AMBULATORY_CARE_PROVIDER_SITE_OTHER): Payer: Managed Care, Other (non HMO) | Admitting: Family Medicine

## 2021-07-12 VITALS — BP 108/66 | HR 82 | Temp 97.5°F | Ht 62.75 in | Wt 147.8 lb

## 2021-07-12 DIAGNOSIS — E038 Other specified hypothyroidism: Secondary | ICD-10-CM

## 2021-07-12 DIAGNOSIS — F3341 Major depressive disorder, recurrent, in partial remission: Secondary | ICD-10-CM

## 2021-07-12 DIAGNOSIS — I10 Essential (primary) hypertension: Secondary | ICD-10-CM | POA: Diagnosis not present

## 2021-07-12 LAB — T4, FREE: Free T4: 1.02 ng/dL (ref 0.60–1.60)

## 2021-07-12 LAB — TSH: TSH: 2.17 u[IU]/mL (ref 0.35–5.50)

## 2021-07-12 NOTE — Patient Instructions (Signed)
Stop spironolactone (Aldactone)

## 2021-07-12 NOTE — Progress Notes (Signed)
[facility] [address] [place] [address] Dept: [phone] Dept Fax: [phone]  Chronic Care Office Visit  Subjective:    Patient ID: Erika Campbell, female    DOB: May 31, 1960, 61 y.o..   MRN: [id number]  Chief Complaint  Patient presents with   Follow-up    3 months f/u     History of Present Illness:  Patient is in today for reassessment of chronic medical issues.  Erika Campbell has a history significant depression issues. She is currently managed on Wellbutrin 450 mg (300 + 150) daily, citalopram 20 mg daily and Klonipin 0.5 mg bid. She notes ongoing stressors related to providing health care to her elderly mother who has dementia. She has home health aides to assist during the weekdays and her sister helps some on the weekends. However, any time she is away her mother has difficulty, making it harder for her to get a break. She did try seeing a counselor, but didn't feel like sh had much to offer her.   Erika Campbell has a history of hypertension. She is managed on valsartan/HCTZ and spironolactone. At her last visit, we had discussed her stopping her spironolactone, but she did not do that yet.  Erika Campbell has a history of subclinical hypothyroidism. We stopped her Cytomel at her last visit. She also stopped her biotin-containing supplement.  Past Medical History: Patient Active Problem List   Diagnosis Date Noted   Mixed hyperlipidemia 04/09/2021   Prediabetes 04/09/2021   Subclinical hypothyroidism 04/09/2021   Essential hypertension 04/09/2021   Vitamin D deficiency 10/15/2020   Genital herpes simplex 07/27/2017   Gastroesophageal reflux disease without esophagitis 07/27/2017   Mild neurocognitive disorder 06/02/2017   Episode of recurrent major depressive disorder (HCC) 06/02/2017   Traumatic brain injury 01/13/2016   Shaking spells 07/16/2015   Periodic limb movement sleep disorder 07/16/2015   Benign  essential tremor 07/16/2015   Past Surgical History:  Procedure Laterality Date   CHOLECYSTECTOMY     Family History  Problem Relation Age of Onset   Heart disease Mother    Stroke Mother    Dementia Mother    Heart disease Father    Diabetes Father    Hypertension Father    Hyperlipidemia Father    Cancer Father        bladder, prostrate   Tremor Father    Cancer Brother        Melanoma   Alcohol abuse Brother    Cancer Maternal Grandmother        Brain   Outpatient Medications Prior to Visit  Medication Sig Dispense Refill   buPROPion (WELLBUTRIN XL) 150 MG 24 hr tablet TAKE 1 TABLET BY MOUTH DAILY. TAKE WITH 300MG  TAB FOR TOTAL OF 450MG  DAILY. 90 tablet 1   buPROPion (WELLBUTRIN XL) 300 MG 24 hr tablet TAKE 1 TABLET BY MOUTH DAILY. TAKE WITH 150MG  TABLET FOR TOTAL OF 450MG  DAILY. 90 tablet 1   citalopram (CELEXA) 20 MG tablet Take 1 tablet (20 mg total) by mouth daily. 90 tablet 3   clonazePAM (KLONOPIN) 0.5 MG tablet Take 1 tablet (0.5 mg total) by mouth 2 (two) times daily. 60 tablet 3   Estradiol 10 MCG INST Place vaginally.     Multiple Vitamins-Minerals (MULTIVITAMIN WITH MINERALS) tablet Take 1 tablet by mouth daily.     progesterone (PROMETRIUM) 100 MG capsule Take by mouth.     senna-docusate (SENOKOT-S) 8.6-50 MG tablet Take 1 tablet by mouth  daily.     valACYclovir (VALTREX) 1000 MG tablet Take 1 tablet (1,000 mg total) by mouth daily. 90 tablet 3   valsartan-hydrochlorothiazide (DIOVAN-HCT) 80-12.5 MG tablet Take 1 tablet by mouth every morning. 90 tablet 3   Vitamin D, Ergocalciferol, (DRISDOL) 1.25 MG (50000 UNIT) CAPS capsule TAKE 1 CAPSULE BY MOUTH ONCE EVERY 7 DAYS. 12 capsule 0   spironolactone (ALDACTONE) 25 MG tablet Take 1 tablet (25 mg total) by mouth daily. 90 tablet 3   dexlansoprazole (DEXILANT) 60 MG capsule 1 capsule (Patient not taking: Reported on 07/12/2021)     liothyronine (CYTOMEL) 5 MCG tablet Take 2 tablets (10 mcg total) by mouth daily. 180  tablet 3   Multiple Vitamins-Minerals (HAIR SKIN AND NAILS FORMULA PO) Take by mouth.     No facility-administered medications prior to visit.   Allergies  Allergen Reactions   Lexapro [Escitalopram]     rages   Lisinopril     nausea   Mirapex [Pramipexole]     paranoid   Propranolol Other (See Comments)    Pt states she did not like the way it affected her body.       Objective:   Today's Vitals   07/12/21 0903  BP: 108/66  Pulse: 82  Temp: (!) 97.5 F (36.4 C)  TempSrc: Temporal  SpO2: 98%  Weight: 147 lb 12.8 oz (67 kg)  Height: 5' 2.75" (1.594 m)   Body mass index is 26.39 kg/m.   General: Well developed, well nourished. No acute distress. Psych: Alert and oriented. Normal mood and affect.  Health Maintenance Due  Topic Date Due   Zoster Vaccines- Shingrix (1 of 2) Never done   COVID-19 Vaccine (3 - Booster for Pfizer series) 10/21/2019   INFLUENZA VACCINE  Never done   Lab Results Last thyroid functions Lab Results  Component Value Date   TSH 1.87 04/16/2021   Depression screen [facility] 2/9 07/12/2021 06/10/2020  Decreased Interest 2 3  Down, Depressed, Hopeless 2 1  PHQ - 2 Score 4 4  Altered sleeping 0 0  Tired, decreased energy 3 2  Change in appetite 1 1  Feeling bad or failure about yourself  0 1  Trouble concentrating 1 1  Moving slowly or fidgety/restless 0 0  Suicidal thoughts 0 0  PHQ-9 Score 9 9  Difficult doing work/chores Not difficult at all Somewhat difficult   GAD 7 : Generalized Anxiety Score 07/12/2021  Nervous, Anxious, on Edge 2  Control/stop worrying 2  Worry too much - different things 0  Trouble relaxing 1  Restless 0  Easily annoyed or irritable 1  Afraid - awful might happen 1  Total GAD 7 Score 7  Anxiety Difficulty Somewhat difficult     Assessment & Plan:   1. Essential hypertension Blood pressure is at goal. We discussed again today about a trial off the spironolactone. She would like to move ahead with this. I will  recheck her BP in 3 months.  2. Subclinical hypothyroidism Since she has been off of the Cytomel, I will check her TSH and free T4 today to determine if she has any need to resume treatment.  - T4, free - TSH  3. Recurrent major depression in partial remission [facility]) Erika Campbell is overall managing despite the significant stressors related to her mother. She is cognizant of her issues and trying to avoid letting this spill over to her spouse.  Loyola Mast, MD

## 2021-07-23 ENCOUNTER — Other Ambulatory Visit: Payer: Self-pay | Admitting: Family Medicine

## 2021-07-23 DIAGNOSIS — I10 Essential (primary) hypertension: Secondary | ICD-10-CM

## 2021-08-09 ENCOUNTER — Other Ambulatory Visit: Payer: Self-pay | Admitting: Family

## 2021-08-09 DIAGNOSIS — E559 Vitamin D deficiency, unspecified: Secondary | ICD-10-CM

## 2021-08-10 ENCOUNTER — Other Ambulatory Visit: Payer: Self-pay | Admitting: Family

## 2021-08-10 DIAGNOSIS — E559 Vitamin D deficiency, unspecified: Secondary | ICD-10-CM

## 2021-08-11 ENCOUNTER — Other Ambulatory Visit: Payer: Self-pay | Admitting: Family Medicine

## 2021-08-11 DIAGNOSIS — E559 Vitamin D deficiency, unspecified: Secondary | ICD-10-CM

## 2021-08-31 ENCOUNTER — Other Ambulatory Visit: Payer: Self-pay | Admitting: Family Medicine

## 2021-08-31 DIAGNOSIS — F32A Depression, unspecified: Secondary | ICD-10-CM

## 2021-09-23 ENCOUNTER — Encounter: Payer: Self-pay | Admitting: Family Medicine

## 2021-09-23 DIAGNOSIS — F419 Anxiety disorder, unspecified: Secondary | ICD-10-CM

## 2021-09-27 MED ORDER — CITALOPRAM HYDROBROMIDE 20 MG PO TABS: 20.0000 mg | ORAL_TABLET | Freq: Every day | ORAL | 3 refills | Status: DC

## 2021-10-11 ENCOUNTER — Ambulatory Visit: Payer: Managed Care, Other (non HMO) | Admitting: Family Medicine

## 2021-11-15 ENCOUNTER — Telehealth: Payer: Self-pay | Admitting: Family Medicine

## 2021-11-15 NOTE — Telephone Encounter (Signed)
Pt would like to transfer to [facility] as her mom was a pt of Abner Greenspan and used to see her. Please advise if TOC is okay.

## 2021-11-17 NOTE — Telephone Encounter (Signed)
Vm left to call back.  ?

## 2021-11-28 ENCOUNTER — Other Ambulatory Visit: Payer: Self-pay | Admitting: Family Medicine

## 2021-11-28 DIAGNOSIS — F419 Anxiety disorder, unspecified: Secondary | ICD-10-CM

## 2021-11-28 DIAGNOSIS — F32A Depression, unspecified: Secondary | ICD-10-CM

## 2021-11-29 NOTE — Telephone Encounter (Signed)
Refill request for  Clonazepam 0.5 mg  LR  08/31/21, # 60, 1 rf LOV  07/12/21 FOV   none scheduled.    Please review and advise.  Thanks  Dm/cma

## 2021-12-01 ENCOUNTER — Other Ambulatory Visit: Payer: Self-pay

## 2021-12-27 ENCOUNTER — Other Ambulatory Visit: Payer: Self-pay | Admitting: Family Medicine

## 2021-12-27 DIAGNOSIS — F32A Depression, unspecified: Secondary | ICD-10-CM

## 2021-12-28 ENCOUNTER — Encounter: Payer: Self-pay | Admitting: Family

## 2021-12-28 ENCOUNTER — Other Ambulatory Visit (HOSPITAL_BASED_OUTPATIENT_CLINIC_OR_DEPARTMENT_OTHER): Payer: Self-pay

## 2021-12-28 ENCOUNTER — Ambulatory Visit (INDEPENDENT_AMBULATORY_CARE_PROVIDER_SITE_OTHER): Payer: Commercial Managed Care - HMO | Admitting: Family

## 2021-12-28 DIAGNOSIS — F3341 Major depressive disorder, recurrent, in partial remission: Secondary | ICD-10-CM | POA: Diagnosis not present

## 2021-12-28 DIAGNOSIS — F419 Anxiety disorder, unspecified: Secondary | ICD-10-CM

## 2021-12-28 DIAGNOSIS — I10 Essential (primary) hypertension: Secondary | ICD-10-CM

## 2021-12-28 DIAGNOSIS — A6000 Herpesviral infection of urogenital system, unspecified: Secondary | ICD-10-CM

## 2021-12-28 DIAGNOSIS — F32A Depression, unspecified: Secondary | ICD-10-CM

## 2021-12-28 DIAGNOSIS — E782 Mixed hyperlipidemia: Secondary | ICD-10-CM

## 2021-12-28 MED ORDER — PROGESTERONE MICRONIZED 100 MG PO CAPS
100.0000 mg | ORAL_CAPSULE | Freq: Every day | ORAL | 1 refills | Status: DC
  Filled 2021-12-28 – 2022-01-16 (×2): qty 90, 90d supply, fill #0

## 2021-12-28 MED ORDER — VALACYCLOVIR HCL 1 G PO TABS
1000.0000 mg | ORAL_TABLET | Freq: Every day | ORAL | 3 refills | Status: AC
  Filled 2021-12-28: qty 30, 30d supply, fill #0
  Filled 2022-01-16: qty 30, 30d supply, fill #1
  Filled 2022-02-21: qty 30, 30d supply, fill #2
  Filled 2022-03-24: qty 30, 30d supply, fill #3
  Filled 2022-04-22: qty 30, 30d supply, fill #4
  Filled 2022-05-21: qty 30, 30d supply, fill #5
  Filled 2022-06-23: qty 30, 30d supply, fill #6
  Filled 2022-07-25 – 2022-08-11 (×2): qty 30, 30d supply, fill #7
  Filled 2022-09-05: qty 30, 30d supply, fill #8

## 2021-12-28 MED ORDER — CLONAZEPAM 0.5 MG PO TABS
0.5000 mg | ORAL_TABLET | Freq: Two times a day (BID) | ORAL | 0 refills | Status: DC
Start: 2021-12-28 — End: 2022-01-16
  Filled 2021-12-28: qty 60, 30d supply, fill #0

## 2021-12-28 MED ORDER — DEXLANSOPRAZOLE 60 MG PO CPDR
60.0000 mg | DELAYED_RELEASE_CAPSULE | Freq: Every day | ORAL | 2 refills | Status: DC
  Filled 2021-12-28: qty 90, 90d supply, fill #0

## 2021-12-28 MED ORDER — LIOTHYRONINE SODIUM 25 MCG PO TABS
25.0000 ug | ORAL_TABLET | Freq: Every day | ORAL | 2 refills | Status: DC
  Filled 2021-12-28: qty 90, 90d supply, fill #0

## 2021-12-28 MED FILL — Valsartan-Hydrochlorothiazide Tab 80-12.5 MG: ORAL | 90 days supply | Qty: 90 | Fill #0 | Status: CN

## 2021-12-28 NOTE — Assessment & Plan Note (Signed)
On Wellbutrin 450mg  once daily as well as citalopram. Stable.

## 2021-12-28 NOTE — Assessment & Plan Note (Addendum)
On clonazepam. Reviewed PDMP aware. Controlled substance contract is signed and UDS is ordered.

## 2021-12-28 NOTE — Assessment & Plan Note (Signed)
Stable on valtrex 1000mg .
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
## 2021-12-28 NOTE — Assessment & Plan Note (Signed)
BP Readings from Last 3 Encounters:  12/28/21 115/78  07/12/21 108/66  04/09/21 114/68   Stable. On Diovan HCT.  Continue same.

## 2021-12-28 NOTE — Assessment & Plan Note (Signed)
Lab Results  Component Value Date   CHOL 180 04/16/2021   HDL 73.00 04/16/2021   LDLCALC 95 04/16/2021   TRIG 62.0 04/16/2021   CHOLHDL 2 04/16/2021   Not on statin. Diet controlled.

## 2021-12-29 LAB — BASIC METABOLIC PANEL
BUN: 10 mg/dL (ref 6–23)
CO2: 29 mEq/L (ref 19–32)
Calcium: 9.4 mg/dL (ref 8.4–10.5)
Chloride: 99 mEq/L (ref 96–112)
Creatinine, Ser: 1.02 mg/dL (ref 0.40–1.20)
GFR: 58.74 mL/min — ABNORMAL LOW (ref >60.00)
Glucose, Bld: 97 mg/dL (ref 70–99)
Potassium: 3.8 mEq/L (ref 3.5–5.1)
Sodium: 137 mEq/L (ref 135–145)

## 2021-12-31 ENCOUNTER — Telehealth: Payer: Self-pay | Admitting: Family

## 2021-12-31 LAB — DRUG MONITORING, PANEL 8 WITH CONFIRMATION, URINE
6 Acetylmorphine: NEGATIVE ng/mL (ref <10)
Alcohol Metabolites: NEGATIVE ng/mL (ref <500)
Alphahydroxyalprazolam: NEGATIVE ng/mL (ref <25)
Alphahydroxymidazolam: NEGATIVE ng/mL (ref <50)
Alphahydroxytriazolam: NEGATIVE ng/mL (ref <50)
Aminoclonazepam: 1517 ng/mL — ABNORMAL HIGH (ref <25)
Amphetamines: NEGATIVE ng/mL (ref <500)
Benzodiazepines: POSITIVE ng/mL — AB (ref <100)
Buprenorphine, Urine: NEGATIVE ng/mL (ref <5)
Cocaine Metabolite: NEGATIVE ng/mL (ref <150)
Codeine: NEGATIVE ng/mL (ref <50)
Creatinine: >300 mg/dL (ref >=20.0)
Hydrocodone: 376 ng/mL — ABNORMAL HIGH (ref <50)
Hydromorphone: NEGATIVE ng/mL (ref <50)
Hydroxyethylflurazepam: NEGATIVE ng/mL (ref <50)
Lorazepam: NEGATIVE ng/mL (ref <50)
MDMA: NEGATIVE ng/mL (ref <500)
Marijuana Metabolite: NEGATIVE ng/mL (ref <20)
Morphine: NEGATIVE ng/mL (ref <50)
Nordiazepam: NEGATIVE ng/mL (ref <50)
Norhydrocodone: 1653 ng/mL — ABNORMAL HIGH (ref <50)
Opiates: POSITIVE ng/mL — AB (ref <100)
Oxazepam: NEGATIVE ng/mL (ref <50)
Oxidant: NEGATIVE ug/mL (ref <200)
Oxycodone: NEGATIVE ng/mL (ref <100)
Temazepam: NEGATIVE ng/mL (ref <50)
pH: 5.6 (ref 4.5–9.0)

## 2021-12-31 LAB — DM TEMPLATE

## 2021-12-31 NOTE — Telephone Encounter (Signed)
See mychart.  

## 2022-01-16 ENCOUNTER — Other Ambulatory Visit: Payer: Self-pay | Admitting: Family

## 2022-01-16 DIAGNOSIS — F419 Anxiety disorder, unspecified: Secondary | ICD-10-CM

## 2022-01-17 ENCOUNTER — Other Ambulatory Visit (HOSPITAL_BASED_OUTPATIENT_CLINIC_OR_DEPARTMENT_OTHER): Payer: Self-pay

## 2022-01-17 MED ORDER — CLONAZEPAM 0.5 MG PO TABS
0.5000 mg | ORAL_TABLET | Freq: Two times a day (BID) | ORAL | 0 refills | Status: DC
  Filled 2022-01-17 – 2022-01-30 (×2): qty 60, 30d supply, fill #0

## 2022-02-01 ENCOUNTER — Other Ambulatory Visit (HOSPITAL_BASED_OUTPATIENT_CLINIC_OR_DEPARTMENT_OTHER): Payer: Self-pay

## 2022-02-21 ENCOUNTER — Other Ambulatory Visit: Payer: Self-pay | Admitting: Family

## 2022-02-21 ENCOUNTER — Ambulatory Visit (INDEPENDENT_AMBULATORY_CARE_PROVIDER_SITE_OTHER): Payer: Commercial Managed Care - HMO | Admitting: Family

## 2022-02-21 ENCOUNTER — Encounter: Payer: Self-pay | Admitting: Family

## 2022-02-21 VITALS — BP 121/70 | HR 79 | Temp 98.1°F | Resp 16 | Ht 62.5 in | Wt 152.0 lb

## 2022-02-21 DIAGNOSIS — Z23 Encounter for immunization: Secondary | ICD-10-CM

## 2022-02-21 DIAGNOSIS — Z0000 Encounter for general adult medical examination without abnormal findings: Secondary | ICD-10-CM | POA: Insufficient documentation

## 2022-02-21 DIAGNOSIS — F419 Anxiety disorder, unspecified: Secondary | ICD-10-CM

## 2022-02-21 MED FILL — Valsartan-Hydrochlorothiazide Tab 80-12.5 MG: ORAL | 90 days supply | Qty: 90 | Fill #0 | Status: AC

## 2022-02-21 NOTE — Progress Notes (Signed)
Subjective:   By signing my name below, I, Erika Campbell, attest that this documentation has been prepared under the direction and in the presence of Erika Campbell. 02/21/2022    Patient ID: Erika Campbell, female    DOB: 12-29-1960, 61 y.o.   MRN: [id number]  Chief Complaint  Patient presents with   Annual Exam         HPI Patient is in today for a comprehensive physical exam.   Constipation- She continues having constipation and reports she's had it since she was an infant. She is taking smooth move tea PRN to manage her symptoms and reports doing well while taking it.   Depression- She continues taking 450 mg Wellbutrin daily PO, 0.5 mg clonazepam 2x daily PO 20 mg celexa daily PO and reports doing well while taking it.   Valtrex- She reports no new flare ups while taking valtrex.   Social history: She has on recent surgical procedures to report. She has no changes to her family medical history. She occasionally drinks alcohol. She does not use tobacco products. She does not use drugs. She is currently retired.  She denies having any unexpected weight change, ear pain, hearing loss and rhinorrhea, visual disturbance, cough, chest pain and leg swelling, nausea, vomiting, diarrhea, constipation, blood in stool, or dysuria and frequency, for myalgias and arthralgias, rash, headaches, adenopathy, current concerns of depression or anxiety at this time.  Immunizations: She is UTD on tetanus vaccines. She is interested in receiving the flu vaccine during this visit. She is not interested in taking the new Covid-19 vaccine.   Colonoscopy: Last completed 09/06/2017.   Pap Smear: Last completed 04/29/2019.   Mammogram: Last completed 12/21/2020. Results showed Further evaluation is suggested for possible distortion in the right breast. Further evaluation is suggested for possible distortion in the left breast.   Dental: She is UTD on dental care.   Vision: She is UTD on  vision care.    Health Maintenance Due  Topic Date Due   Zoster Vaccines- Shingrix (1 of 2) Never done   COVID-19 Vaccine (3 - Pfizer series) 10/21/2019   INFLUENZA VACCINE  Never done    Past Medical History:  Diagnosis Date   Anxiety    Depression    GERD (gastroesophageal reflux disease)    H/O breast biopsy    Hypertension    PTSD (post-traumatic stress disorder)    Thyroid disease     Past Surgical History:  Procedure Laterality Date   CHOLECYSTECTOMY      Family History  Problem Relation Age of Onset   Heart disease Mother    Stroke Mother    Dementia Mother    Heart disease Father    Diabetes Father    Hypertension Father    Hyperlipidemia Father    Cancer Father        bladder, prostrate   Tremor Father    Diabetes type II Sister    Cancer Brother        Melanoma   Alcohol abuse Brother    Diabetes Mellitus II Brother    Cancer Maternal Grandmother        Brain    Social History   Socioeconomic History   Marital status: Married    Spouse name: Erika Campbell   Number of children: 0   Years of education: Not on file   Highest education level: Not on file  Occupational History   Occupation: Retired- Forensic psychologist  Tobacco Use   Smoking  status: Never   Smokeless tobacco: Never  Vaping Use   Vaping Use: Never used  Substance and Sexual Activity   Alcohol use: Yes    Comment: occasionally   Drug use: Never   Sexual activity: Yes  Other Topics Concern   Not on file  Social History Narrative   Retired Pensions consultant   Married (2nd marriage)   First husband was murdered   No children   Has 1 dog which was her mom's   Social Determinants of Corporate investment banker Strain: Not on file  Food Insecurity: Not on file  Transportation Needs: Not on file  Physical Activity: Not on file  Stress: Not on file  Social Connections: Not on file  Intimate Partner Violence: Not on file    Outpatient Medications Prior to Visit  Medication Sig Dispense Refill    buPROPion (WELLBUTRIN XL) 150 MG 24 hr tablet TAKE 1 TABLET BY MOUTH DAILY. TAKE WITH 300MG  TAB FOR TOTAL OF 450MG  DAILY. 90 tablet 1   buPROPion (WELLBUTRIN XL) 300 MG 24 hr tablet TAKE 1 TABLET BY MOUTH DAILY. TAKE WITH 150MG  TABLET FOR TOTAL OF 450MG  DAILY. 90 tablet 1   citalopram (CELEXA) 20 MG tablet Take 1 tablet (20 mg total) by mouth daily. 90 tablet 3   clonazePAM (KLONOPIN) 0.5 MG tablet Take 1 tablet (0.5 mg total) by mouth 2 (two) times daily. 60 tablet 0   Estradiol 10 MCG INST Place vaginally.     Multiple Vitamins-Minerals (MULTIVITAMIN WITH MINERALS) tablet Take 1 tablet by mouth daily.     progesterone (PROMETRIUM) 100 MG capsule Take 1 capsule (100 mg total) by mouth daily. 90 capsule 1   valACYclovir (VALTREX) 1000 MG tablet Take 1 tablet (1,000 mg total) by mouth daily. 90 tablet 3   valsartan-hydrochlorothiazide (DIOVAN-HCT) 80-12.5 MG tablet Take 1 tablet by mouth in the morning. 90 tablet 3   dexlansoprazole (DEXILANT) 60 MG capsule Take 1 capsule (60 mg total) by mouth daily. 90 capsule 2   liothyronine (CYTOMEL) 25 MCG tablet Take 1 tablet (25 mcg total) by mouth daily on an empty stomach. 90 tablet 2   progesterone (PROMETRIUM) 100 MG capsule Take by mouth.     No facility-administered medications prior to visit.    Allergies  Allergen Reactions   Lexapro [Escitalopram]     rages   Lisinopril     nausea   Mirapex [Pramipexole]     paranoid   Propranolol Other (See Comments)    Pt states she did not like the way it affected her body.     Review of Systems  Constitutional:  Negative for fever.       (-)unexpected weight change (-)Adenopathy  HENT:  Negative for congestion, sinus pain and sore throat.   Eyes:        (-)Visual disturbance  Respiratory:  Negative for cough, shortness of breath and wheezing.   Cardiovascular:  Negative for chest pain, palpitations and leg swelling.  Gastrointestinal:  Negative for blood in stool, constipation, diarrhea,  nausea and vomiting.  Genitourinary:  Negative for dysuria, frequency and hematuria.  Musculoskeletal:        (-)new muscle pain (-)new joint pain  Skin:        (-)new moles  Neurological:  Negative for dizziness and headaches.  Psychiatric/Behavioral:  Negative for depression. The patient is not nervous/anxious.        Objective:    Physical Exam Constitutional:      General: She is  not in acute distress.    Appearance: Normal appearance. She is not ill-appearing.  HENT:     Head: Normocephalic and atraumatic.     Right Ear: Tympanic membrane, ear canal and external ear normal.     Left Ear: Tympanic membrane, ear canal and external ear normal.  Eyes:     Extraocular Movements: Extraocular movements intact.     Right eye: No nystagmus.     Left eye: No nystagmus.     Pupils: Pupils are equal, round, and reactive to light.  Neck:     Thyroid: No thyroid tenderness.  Cardiovascular:     Rate and Rhythm: Normal rate and regular rhythm.     Heart sounds: Normal heart sounds. No murmur heard.    No gallop.  Pulmonary:     Effort: Pulmonary effort is normal. No respiratory distress.     Breath sounds: Normal breath sounds. No wheezing or rales.  Abdominal:     General: There is no distension.     Palpations: Abdomen is soft.     Tenderness: There is no abdominal tenderness. There is no guarding.  Musculoskeletal:     Comments: 5/5 strength in both upper and lower extremities  Lymphadenopathy:     Cervical: No cervical adenopathy.  Skin:    General: Skin is warm and dry.  Neurological:     Mental Status: She is alert and oriented to person, place, and time.     Deep Tendon Reflexes:     Reflex Scores:      Patellar reflexes are 2+ on the right side and 2+ on the left side. Psychiatric:        Judgment: Judgment normal.     BP 121/70 (BP Location: Right Arm, Patient Position: Sitting, Cuff Size: Small)   Pulse 79   Temp 98.1 F (36.7 C) (Oral)   Resp 16   Ht 5'  2.5" (1.588 m)   Wt 152 lb (68.9 kg)   SpO2 97%   BMI 27.36 kg/m  Wt Readings from Last 3 Encounters:  02/21/22 152 lb (68.9 kg)  12/28/21 150 lb (68 kg)  07/12/21 147 lb 12.8 oz (67 kg)       Assessment & Plan:   Problem List Items Addressed This Visit       Unprioritized   Preventative health care - Primary    Declines covid booster, declines pap. She plans to schedule mammogram.  Flu shot today.        Other Visit Diagnoses     Needs flu shot       Relevant Orders   Flu Vaccine QUAD 6+ mos PF IM (Fluarix Quad PF)        No orders of the defined types were placed in this encounter.   I, Lemont Fillers, Campbell, personally preformed the services described in this documentation.  All medical record entries made by the scribe were at my direction and in my presence.  I have reviewed the chart and discharge instructions (if applicable) and agree that the record reflects my personal performance and is accurate and complete. 02/21/2022   I,Erika Campbell,acting as a scribe for Lemont Fillers, Campbell.,have documented all relevant documentation on the behalf of Lemont Fillers, Campbell,as directed by  Lemont Fillers, Campbell while in the presence of Lemont Fillers, Campbell.   Lemont Fillers, Campbell

## 2022-02-21 NOTE — Assessment & Plan Note (Signed)
Declines covid booster, declines pap. She plans to schedule mammogram.  Flu shot today.

## 2022-02-21 NOTE — Patient Instructions (Signed)
Please call your dermatologist to schedule an appointment to have spot on your right hand evaluated.

## 2022-02-22 ENCOUNTER — Other Ambulatory Visit (HOSPITAL_BASED_OUTPATIENT_CLINIC_OR_DEPARTMENT_OTHER): Payer: Self-pay

## 2022-02-22 MED ORDER — CLONAZEPAM 0.5 MG PO TABS
0.5000 mg | ORAL_TABLET | Freq: Two times a day (BID) | ORAL | 0 refills | Status: DC
Start: 2022-02-22 — End: 2022-04-12
  Filled 2022-02-22 – 2022-03-02 (×2): qty 60, 30d supply, fill #0

## 2022-02-23 ENCOUNTER — Other Ambulatory Visit (HOSPITAL_BASED_OUTPATIENT_CLINIC_OR_DEPARTMENT_OTHER): Payer: Self-pay

## 2022-03-02 ENCOUNTER — Other Ambulatory Visit (HOSPITAL_BASED_OUTPATIENT_CLINIC_OR_DEPARTMENT_OTHER): Payer: Self-pay

## 2022-03-14 ENCOUNTER — Other Ambulatory Visit: Payer: Self-pay | Admitting: Family

## 2022-03-14 DIAGNOSIS — Z1231 Encounter for screening mammogram for malignant neoplasm of breast: Secondary | ICD-10-CM

## 2022-03-24 ENCOUNTER — Other Ambulatory Visit (HOSPITAL_BASED_OUTPATIENT_CLINIC_OR_DEPARTMENT_OTHER): Payer: Self-pay

## 2022-04-12 ENCOUNTER — Other Ambulatory Visit: Payer: Self-pay | Admitting: Family

## 2022-04-12 ENCOUNTER — Other Ambulatory Visit (HOSPITAL_BASED_OUTPATIENT_CLINIC_OR_DEPARTMENT_OTHER): Payer: Self-pay

## 2022-04-12 DIAGNOSIS — F419 Anxiety disorder, unspecified: Secondary | ICD-10-CM

## 2022-04-12 MED ORDER — CLONAZEPAM 0.5 MG PO TABS
0.5000 mg | ORAL_TABLET | Freq: Two times a day (BID) | ORAL | 0 refills | Status: DC
  Filled 2022-04-12: qty 60, 30d supply, fill #0

## 2022-04-12 NOTE — Telephone Encounter (Signed)
Requesting: clonazepam 0.5mg   Contract:12/28/21 UDS: 12/28/21 Last Visit: 02/21/22 Next Visit: 08/22/22 Last Refill: 02/22/22 #60 and 0RF   Please Advise

## 2022-04-13 ENCOUNTER — Other Ambulatory Visit: Payer: Self-pay

## 2022-04-13 DIAGNOSIS — F32A Depression, unspecified: Secondary | ICD-10-CM

## 2022-04-13 MED ORDER — BUPROPION HCL ER (XL) 150 MG PO TB24
150.0000 mg | ORAL_TABLET | Freq: Every day | ORAL | 1 refills | Status: DC
  Filled 2022-04-14: qty 90, 90d supply, fill #0
  Filled 2022-07-09 – 2022-07-11 (×2): qty 90, 90d supply, fill #1

## 2022-04-14 ENCOUNTER — Other Ambulatory Visit: Payer: Self-pay | Admitting: Family Medicine

## 2022-04-14 ENCOUNTER — Other Ambulatory Visit: Payer: Self-pay | Admitting: Family

## 2022-04-14 ENCOUNTER — Other Ambulatory Visit (HOSPITAL_BASED_OUTPATIENT_CLINIC_OR_DEPARTMENT_OTHER): Payer: Self-pay

## 2022-04-14 DIAGNOSIS — F32A Anxiety disorder, unspecified: Secondary | ICD-10-CM

## 2022-04-14 DIAGNOSIS — F419 Anxiety disorder, unspecified: Secondary | ICD-10-CM

## 2022-04-14 MED ORDER — BUPROPION HCL ER (XL) 300 MG PO TB24
300.0000 mg | ORAL_TABLET | Freq: Every day | ORAL | 1 refills | Status: DC
  Filled 2022-04-14: qty 90, 90d supply, fill #0
  Filled 2022-07-09 – 2022-07-11 (×2): qty 90, 90d supply, fill #1

## 2022-04-14 MED ORDER — CITALOPRAM HYDROBROMIDE 20 MG PO TABS
20.0000 mg | ORAL_TABLET | Freq: Every day | ORAL | 3 refills | Status: AC
  Filled 2022-04-14: qty 90, 90d supply, fill #0
  Filled 2022-07-09 – 2022-07-11 (×2): qty 90, 90d supply, fill #1
  Filled 2023-01-03: qty 90, 90d supply, fill #2
  Filled 2023-03-31: qty 90, 90d supply, fill #3

## 2022-04-22 ENCOUNTER — Other Ambulatory Visit (HOSPITAL_BASED_OUTPATIENT_CLINIC_OR_DEPARTMENT_OTHER): Payer: Self-pay

## 2022-04-28 ENCOUNTER — Ambulatory Visit
Admission: RE | Admit: 2022-04-28 | Discharge: 2022-04-28 | Disposition: A | Discharge status: Home / Self Care | Payer: Commercial Managed Care - HMO | Source: Ambulatory Visit | Attending: Family | Admitting: Family

## 2022-04-28 DIAGNOSIS — Z1231 Encounter for screening mammogram for malignant neoplasm of breast: Secondary | ICD-10-CM

## 2022-05-08 ENCOUNTER — Encounter: Payer: Self-pay | Admitting: Family

## 2022-05-08 ENCOUNTER — Other Ambulatory Visit: Payer: Self-pay | Admitting: Family

## 2022-05-08 DIAGNOSIS — F32A Depression, unspecified: Secondary | ICD-10-CM

## 2022-05-09 ENCOUNTER — Other Ambulatory Visit (HOSPITAL_BASED_OUTPATIENT_CLINIC_OR_DEPARTMENT_OTHER): Payer: Self-pay

## 2022-05-09 MED ORDER — PROGESTERONE MICRONIZED 100 MG PO CAPS
100.0000 mg | ORAL_CAPSULE | Freq: Every day | ORAL | 1 refills | Status: DC
  Filled 2022-05-09: qty 90, 90d supply, fill #0
  Filled 2022-08-02 – 2022-08-11 (×2): qty 90, 90d supply, fill #1

## 2022-05-09 NOTE — Telephone Encounter (Signed)
Requesting: clonazepam 0.5mg  Contract:  UDS: 12/28/21 Last Visit: 02/21/22 Next Visit: none Last Refill: 04/12/22  Please Advise

## 2022-05-10 ENCOUNTER — Other Ambulatory Visit (HOSPITAL_BASED_OUTPATIENT_CLINIC_OR_DEPARTMENT_OTHER): Payer: Self-pay

## 2022-05-10 MED ORDER — CLONAZEPAM 0.5 MG PO TABS
0.5000 mg | ORAL_TABLET | Freq: Two times a day (BID) | ORAL | 0 refills | Status: DC
  Filled 2022-05-10 (×2): qty 60, 30d supply, fill #0

## 2022-05-21 MED FILL — Valsartan-Hydrochlorothiazide Tab 80-12.5 MG: ORAL | 90 days supply | Qty: 90 | Fill #1 | Status: AC

## 2022-06-07 ENCOUNTER — Other Ambulatory Visit: Payer: Self-pay | Admitting: Family

## 2022-06-07 DIAGNOSIS — F419 Anxiety disorder, unspecified: Secondary | ICD-10-CM

## 2022-06-07 NOTE — Telephone Encounter (Signed)
Requesting: clonazepam 0.5mg   Contract: 12/28/21 UDS: 12/28/21 Last Visit: 02/21/22 Next Visit: None Last Refill: 05/10/22 #60 and 0RF   Please Advise

## 2022-06-08 ENCOUNTER — Other Ambulatory Visit (HOSPITAL_BASED_OUTPATIENT_CLINIC_OR_DEPARTMENT_OTHER): Payer: Self-pay

## 2022-06-08 MED ORDER — CLONAZEPAM 0.5 MG PO TABS
0.5000 mg | ORAL_TABLET | Freq: Two times a day (BID) | ORAL | 0 refills | Status: DC
  Filled 2022-06-08: qty 60, 30d supply, fill #0

## 2022-06-10 ENCOUNTER — Other Ambulatory Visit (HOSPITAL_BASED_OUTPATIENT_CLINIC_OR_DEPARTMENT_OTHER): Payer: Self-pay

## 2022-06-23 ENCOUNTER — Other Ambulatory Visit (HOSPITAL_BASED_OUTPATIENT_CLINIC_OR_DEPARTMENT_OTHER): Payer: Self-pay

## 2022-07-09 ENCOUNTER — Other Ambulatory Visit: Payer: Self-pay | Admitting: Family

## 2022-07-09 DIAGNOSIS — F419 Anxiety disorder, unspecified: Secondary | ICD-10-CM

## 2022-07-11 ENCOUNTER — Other Ambulatory Visit: Payer: Self-pay

## 2022-07-11 ENCOUNTER — Other Ambulatory Visit (HOSPITAL_BASED_OUTPATIENT_CLINIC_OR_DEPARTMENT_OTHER): Payer: Self-pay

## 2022-07-11 MED ORDER — CLONAZEPAM 0.5 MG PO TABS
0.5000 mg | ORAL_TABLET | Freq: Two times a day (BID) | ORAL | 0 refills | Status: DC
  Filled 2022-07-11: qty 60, 30d supply, fill #0

## 2022-07-22 ENCOUNTER — Other Ambulatory Visit (HOSPITAL_BASED_OUTPATIENT_CLINIC_OR_DEPARTMENT_OTHER): Payer: Self-pay

## 2022-08-01 ENCOUNTER — Other Ambulatory Visit (HOSPITAL_BASED_OUTPATIENT_CLINIC_OR_DEPARTMENT_OTHER): Payer: Self-pay

## 2022-08-02 ENCOUNTER — Other Ambulatory Visit (HOSPITAL_BASED_OUTPATIENT_CLINIC_OR_DEPARTMENT_OTHER): Payer: Self-pay

## 2022-08-09 ENCOUNTER — Other Ambulatory Visit (HOSPITAL_BASED_OUTPATIENT_CLINIC_OR_DEPARTMENT_OTHER): Payer: Self-pay

## 2022-08-11 ENCOUNTER — Other Ambulatory Visit (HOSPITAL_BASED_OUTPATIENT_CLINIC_OR_DEPARTMENT_OTHER): Payer: Self-pay

## 2022-08-22 ENCOUNTER — Ambulatory Visit: Payer: Commercial Managed Care - HMO | Admitting: Family

## 2022-08-22 ENCOUNTER — Telehealth: Payer: Self-pay | Admitting: Family Medicine

## 2022-08-22 ENCOUNTER — Other Ambulatory Visit (HOSPITAL_BASED_OUTPATIENT_CLINIC_OR_DEPARTMENT_OTHER): Payer: Self-pay

## 2022-08-22 DIAGNOSIS — I10 Essential (primary) hypertension: Secondary | ICD-10-CM

## 2022-08-22 MED ORDER — VALSARTAN-HYDROCHLOROTHIAZIDE 80-12.5 MG PO TABS
1.0000 | ORAL_TABLET | Freq: Every morning | ORAL | 0 refills | Status: DC
  Filled 2022-08-22: qty 30, 30d supply, fill #0

## 2022-08-22 NOTE — Telephone Encounter (Signed)
Please contact pt to schedule a follow up visit. 

## 2022-08-23 NOTE — Telephone Encounter (Signed)
Pt states her ins no longer covers our office and she had to switch the [facility]. She wanted to thank [facility] for being her provider and would've stayed but did not have an option.

## 2022-09-05 ENCOUNTER — Other Ambulatory Visit: Payer: Self-pay | Admitting: Family

## 2022-09-05 ENCOUNTER — Other Ambulatory Visit (HOSPITAL_BASED_OUTPATIENT_CLINIC_OR_DEPARTMENT_OTHER): Payer: Self-pay

## 2022-09-05 ENCOUNTER — Other Ambulatory Visit: Payer: Self-pay

## 2022-09-05 ENCOUNTER — Telehealth: Payer: Self-pay | Admitting: Family

## 2022-09-05 DIAGNOSIS — F419 Anxiety disorder, unspecified: Secondary | ICD-10-CM

## 2022-09-05 MED ORDER — CLONAZEPAM 0.5 MG PO TABS
0.5000 mg | ORAL_TABLET | Freq: Two times a day (BID) | ORAL | 0 refills | Status: DC
  Filled 2022-09-05: qty 60, 30d supply, fill #0

## 2022-09-05 NOTE — Telephone Encounter (Signed)
Please contact pt to schedule a follow up visit.  

## 2022-09-05 NOTE — Telephone Encounter (Signed)
Requesting: clonazepam 0.5mg   Contract: 12/28/21 UDS: 12/28/21 Last Visit: 02/21/22 Next Visit: None Last Refill:  07/11/22 #60 and 0RF   Please Advise

## 2022-09-07 NOTE — Telephone Encounter (Signed)
Pt states her ins is no longer in network with [place] so she had to est with another pcp.

## 2022-09-21 ENCOUNTER — Other Ambulatory Visit (HOSPITAL_BASED_OUTPATIENT_CLINIC_OR_DEPARTMENT_OTHER): Payer: Self-pay

## 2022-09-21 ENCOUNTER — Other Ambulatory Visit: Payer: Self-pay | Admitting: Family

## 2022-09-21 DIAGNOSIS — I10 Essential (primary) hypertension: Secondary | ICD-10-CM

## 2022-10-04 ENCOUNTER — Other Ambulatory Visit (HOSPITAL_BASED_OUTPATIENT_CLINIC_OR_DEPARTMENT_OTHER): Payer: Self-pay

## 2022-10-04 ENCOUNTER — Other Ambulatory Visit: Payer: Self-pay

## 2022-10-04 MED ORDER — VALACYCLOVIR HCL 1 G PO TABS
1000.0000 mg | ORAL_TABLET | Freq: Every day | ORAL | 3 refills | Status: DC
  Filled 2022-10-04: qty 30, 30d supply, fill #0
  Filled 2022-12-05: qty 30, 30d supply, fill #1
  Filled 2022-12-21 – 2023-01-05 (×2): qty 30, 30d supply, fill #2
  Filled 2023-02-04: qty 30, 30d supply, fill #3
  Filled 2023-03-05: qty 30, 30d supply, fill #4
  Filled 2023-04-10: qty 30, 30d supply, fill #5
  Filled 2023-05-10: qty 30, 30d supply, fill #6
  Filled 2023-06-11: qty 30, 30d supply, fill #7
  Filled 2023-07-25: qty 30, 30d supply, fill #8
  Filled 2023-08-24: qty 30, 30d supply, fill #9
  Filled 2023-09-28: qty 30, 30d supply, fill #10

## 2022-10-04 MED ORDER — BUPROPION HCL ER (XL) 300 MG PO TB24
300.0000 mg | ORAL_TABLET | Freq: Every morning | ORAL | 1 refills | Status: DC
  Filled 2022-10-04: qty 90, 90d supply, fill #0
  Filled 2022-12-27: qty 90, 90d supply, fill #1

## 2022-10-04 MED ORDER — VALSARTAN-HYDROCHLOROTHIAZIDE 80-12.5 MG PO TABS
1.0000 | ORAL_TABLET | Freq: Every morning | ORAL | 1 refills | Status: DC
  Filled 2022-10-04: qty 90, 90d supply, fill #0
  Filled 2022-12-27: qty 90, 90d supply, fill #1

## 2022-10-04 MED ORDER — CLONAZEPAM 0.5 MG PO TABS
0.5000 mg | ORAL_TABLET | Freq: Two times a day (BID) | ORAL | 5 refills | Status: DC
  Filled 2022-10-04: qty 60, 30d supply, fill #0
  Filled 2022-10-29 – 2022-11-02 (×2): qty 60, 30d supply, fill #1
  Filled 2022-12-21: qty 60, 30d supply, fill #2
  Filled 2023-01-05 – 2023-01-21 (×2): qty 60, 30d supply, fill #3
  Filled 2023-02-18: qty 60, 30d supply, fill #4
  Filled 2023-03-24: qty 60, 30d supply, fill #5

## 2022-10-04 MED ORDER — BUPROPION HCL ER (XL) 150 MG PO TB24
150.0000 mg | ORAL_TABLET | Freq: Every morning | ORAL | 1 refills | Status: DC
  Filled 2022-10-04: qty 90, 90d supply, fill #0
  Filled 2022-12-27: qty 90, 90d supply, fill #1

## 2022-10-04 MED ORDER — CITALOPRAM HYDROBROMIDE 20 MG PO TABS
20.0000 mg | ORAL_TABLET | Freq: Every day | ORAL | 1 refills | Status: DC
  Filled 2022-10-04: qty 90, 90d supply, fill #0
  Filled 2022-10-29 – 2023-06-28 (×2): qty 90, 90d supply, fill #1

## 2022-10-31 ENCOUNTER — Other Ambulatory Visit (HOSPITAL_BASED_OUTPATIENT_CLINIC_OR_DEPARTMENT_OTHER): Payer: Self-pay

## 2022-11-02 ENCOUNTER — Other Ambulatory Visit: Payer: Self-pay

## 2022-11-02 ENCOUNTER — Other Ambulatory Visit (HOSPITAL_BASED_OUTPATIENT_CLINIC_OR_DEPARTMENT_OTHER): Payer: Self-pay

## 2022-11-07 ENCOUNTER — Other Ambulatory Visit (HOSPITAL_BASED_OUTPATIENT_CLINIC_OR_DEPARTMENT_OTHER): Payer: Self-pay

## 2022-11-09 ENCOUNTER — Other Ambulatory Visit: Payer: Self-pay

## 2022-11-09 ENCOUNTER — Other Ambulatory Visit (HOSPITAL_BASED_OUTPATIENT_CLINIC_OR_DEPARTMENT_OTHER): Payer: Self-pay

## 2022-11-09 MED ORDER — PROGESTERONE MICRONIZED 100 MG PO CAPS
100.0000 mg | ORAL_CAPSULE | Freq: Every day | ORAL | 0 refills | Status: DC
  Filled 2022-11-09: qty 30, 30d supply, fill #0

## 2022-11-09 MED ORDER — SPIRONOLACTONE 25 MG PO TABS
25.0000 mg | ORAL_TABLET | Freq: Every day | ORAL | 0 refills | Status: DC
  Filled 2022-11-09: qty 30, 30d supply, fill #0

## 2022-12-08 ENCOUNTER — Other Ambulatory Visit (HOSPITAL_BASED_OUTPATIENT_CLINIC_OR_DEPARTMENT_OTHER): Payer: Self-pay

## 2022-12-21 ENCOUNTER — Other Ambulatory Visit: Payer: Self-pay

## 2022-12-22 ENCOUNTER — Other Ambulatory Visit (HOSPITAL_BASED_OUTPATIENT_CLINIC_OR_DEPARTMENT_OTHER): Payer: Self-pay

## 2022-12-26 ENCOUNTER — Other Ambulatory Visit (HOSPITAL_BASED_OUTPATIENT_CLINIC_OR_DEPARTMENT_OTHER): Payer: Self-pay

## 2022-12-26 MED ORDER — SPIRONOLACTONE 25 MG PO TABS
25.0000 mg | ORAL_TABLET | Freq: Every day | ORAL | 0 refills | Status: DC
  Filled 2022-12-26: qty 30, 30d supply, fill #0

## 2022-12-26 MED ORDER — PROGESTERONE MICRONIZED 100 MG PO CAPS
100.0000 mg | ORAL_CAPSULE | Freq: Every day | ORAL | 0 refills | Status: DC
  Filled 2022-12-26: qty 30, 30d supply, fill #0

## 2022-12-27 ENCOUNTER — Other Ambulatory Visit (HOSPITAL_BASED_OUTPATIENT_CLINIC_OR_DEPARTMENT_OTHER): Payer: Self-pay

## 2023-01-05 ENCOUNTER — Other Ambulatory Visit (HOSPITAL_BASED_OUTPATIENT_CLINIC_OR_DEPARTMENT_OTHER): Payer: Self-pay

## 2023-01-06 ENCOUNTER — Other Ambulatory Visit: Payer: Self-pay

## 2023-01-06 ENCOUNTER — Other Ambulatory Visit (HOSPITAL_BASED_OUTPATIENT_CLINIC_OR_DEPARTMENT_OTHER): Payer: Self-pay

## 2023-01-19 ENCOUNTER — Other Ambulatory Visit (HOSPITAL_BASED_OUTPATIENT_CLINIC_OR_DEPARTMENT_OTHER): Payer: Self-pay

## 2023-01-22 ENCOUNTER — Other Ambulatory Visit: Payer: Self-pay

## 2023-01-23 ENCOUNTER — Other Ambulatory Visit (HOSPITAL_BASED_OUTPATIENT_CLINIC_OR_DEPARTMENT_OTHER): Payer: Self-pay

## 2023-01-23 MED ORDER — SPIRONOLACTONE 25 MG PO TABS
25.0000 mg | ORAL_TABLET | Freq: Every day | ORAL | 0 refills | Status: DC
  Filled 2023-01-23: qty 30, 30d supply, fill #0

## 2023-01-23 MED ORDER — PROGESTERONE MICRONIZED 100 MG PO CAPS
100.0000 mg | ORAL_CAPSULE | Freq: Every day | ORAL | 0 refills | Status: DC
  Filled 2023-01-23: qty 30, 30d supply, fill #0

## 2023-01-29 IMAGING — MG MM BREAST LOCALIZATION CLIP
4 series · 4 of 12 positions shown · non-contrast
Comparison: Previous exam(s).

CLINICAL DATA: Post biopsy mammogram of the right breast for clip
placement.

EXAM:
3D DIAGNOSTIC RIGHT MAMMOGRAM POST ULTRASOUND BIOPSY

[R ML synth-2D]
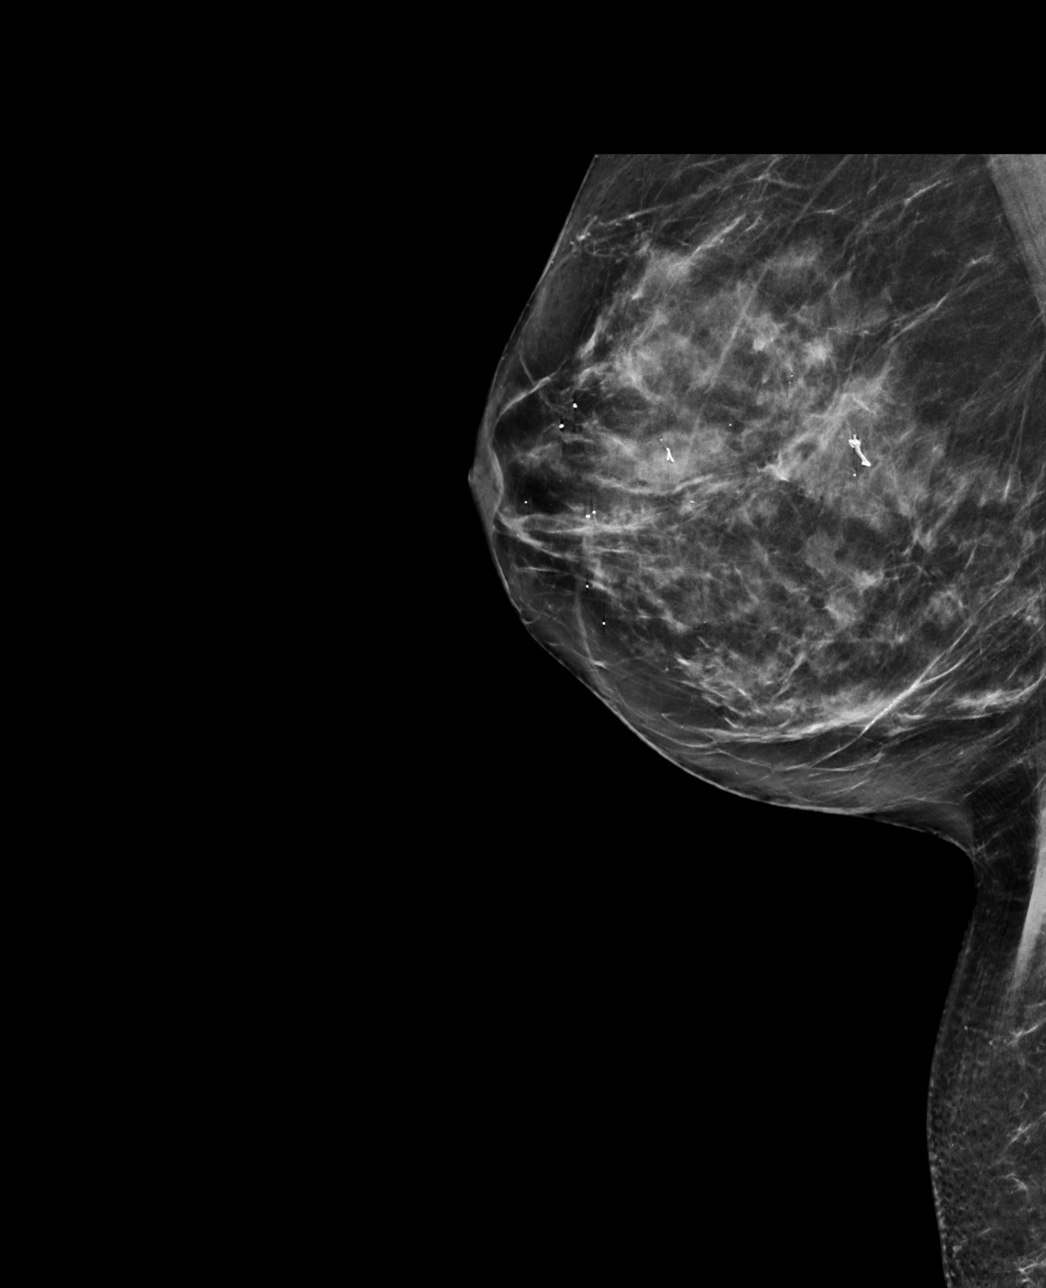

[R CC synth-2D]
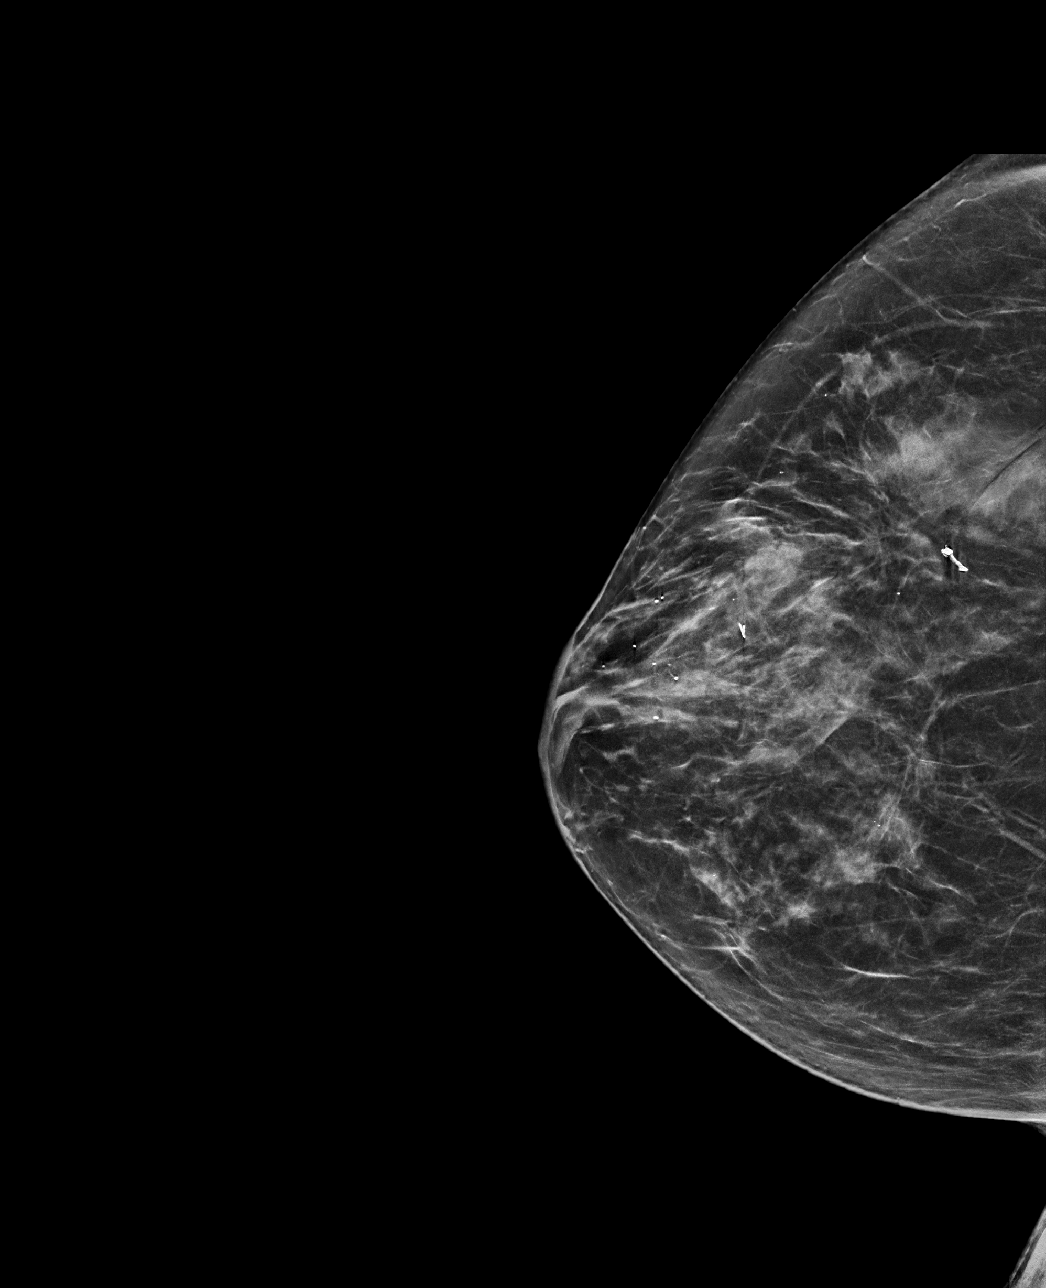

[R ML tomo · tomo slice 35/69.0]
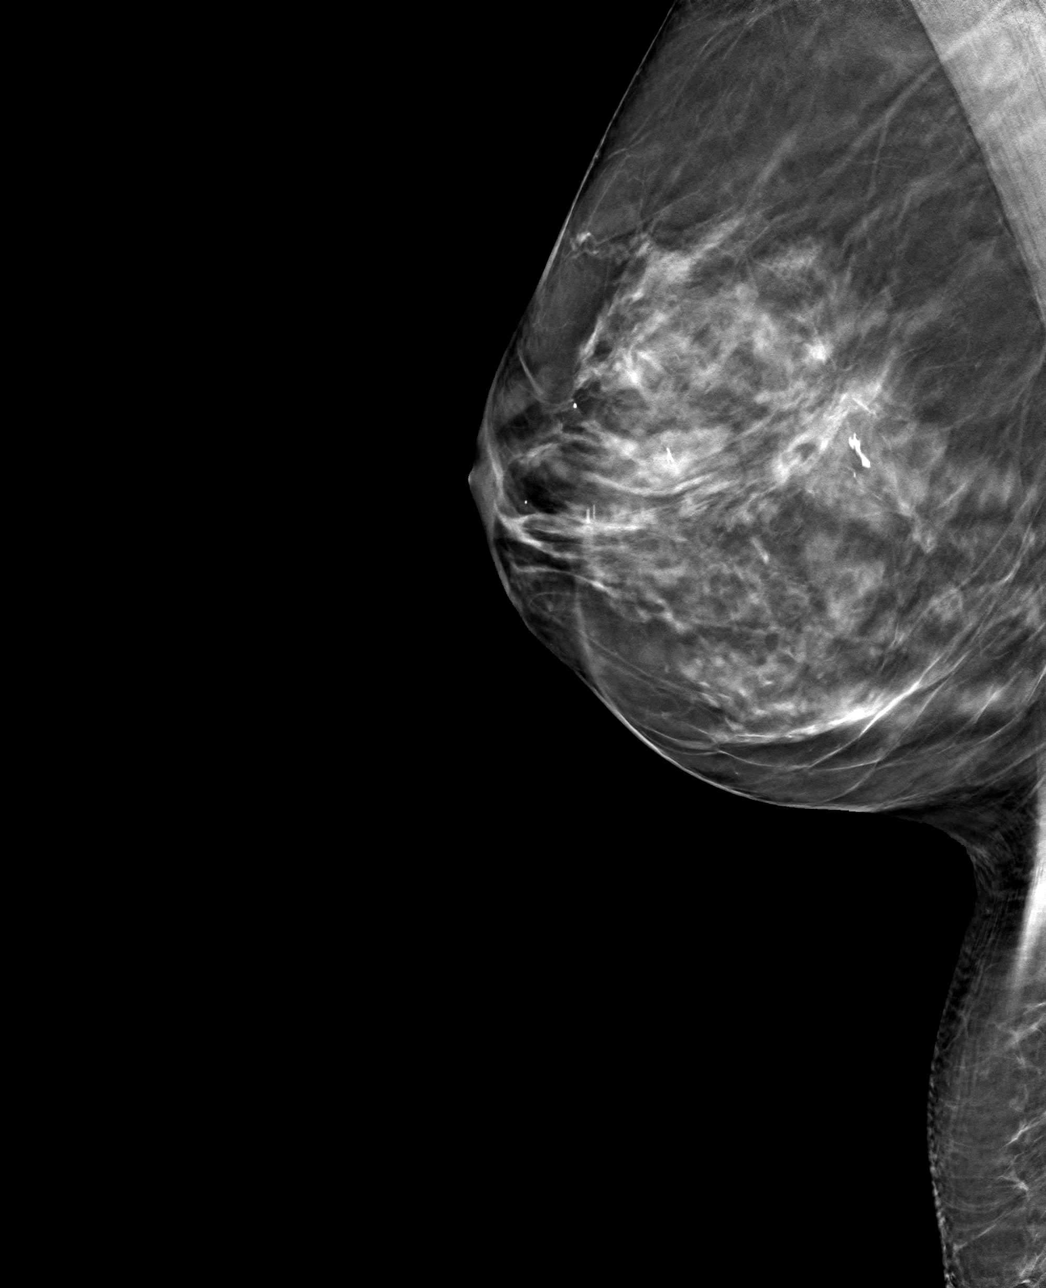

[R CC tomo · tomo slice 33/65.0]
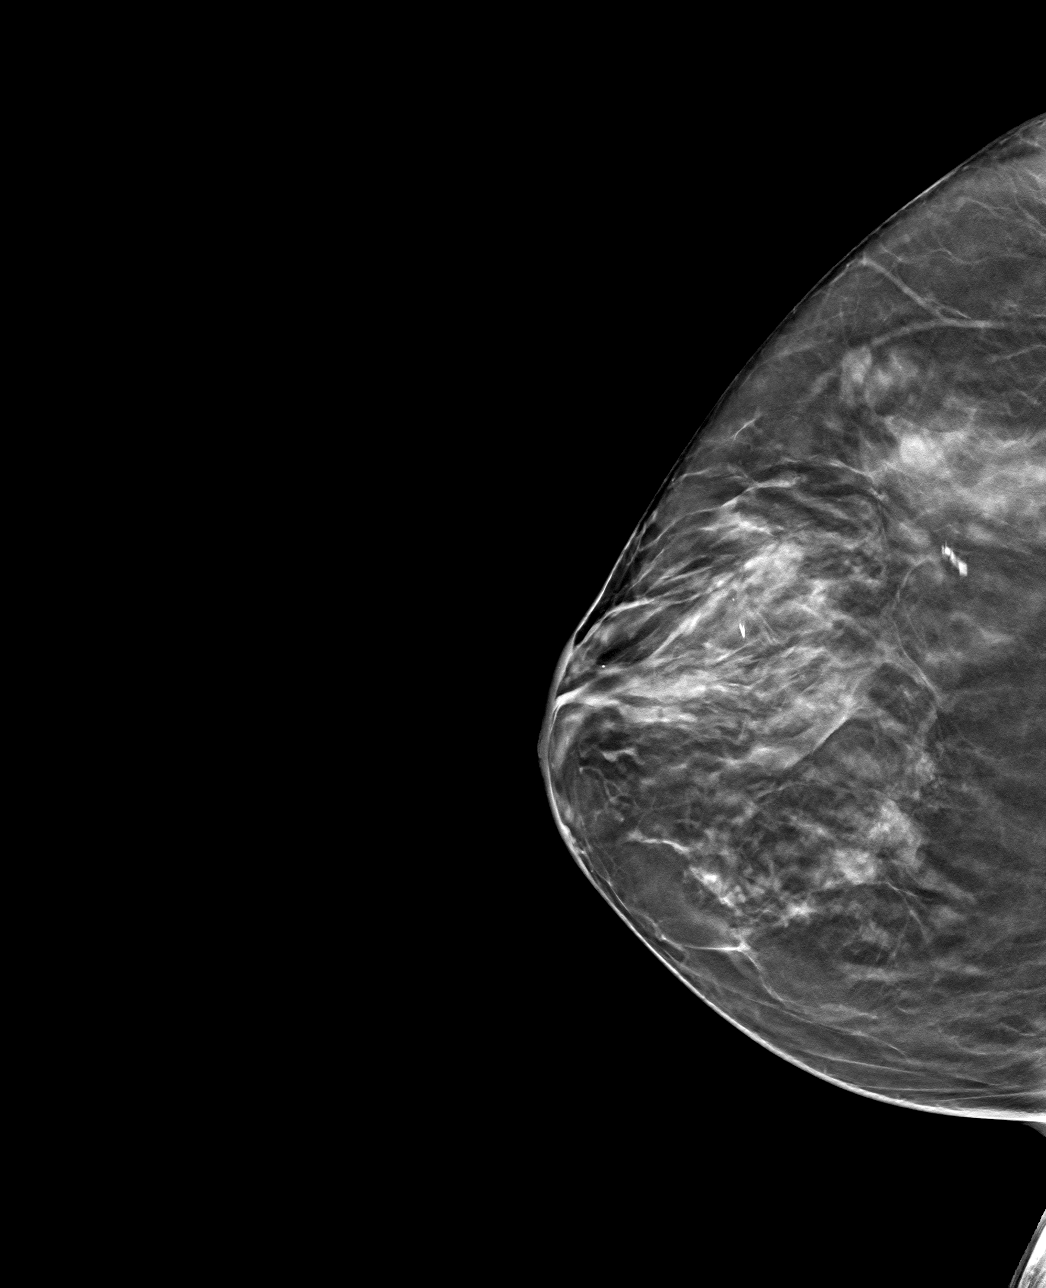

[4 of 12 positions shown; findings below may reference images not displayed]

FINDINGS: 3D Mammographic images were obtained following ultrasound guided
biopsy of a mass in the right breast at 9 o'clock. The biopsy
marking clip is in expected position at the site of biopsy.
IMPRESSION: Appropriate positioning of the ribbon shaped biopsy marking clip at
the site of biopsy in the lateral right breast.

Final Assessment: Post Procedure Mammograms for Marker Placement

## 2023-01-29 IMAGING — US US  BREAST BX W/ LOC DEV 1ST LESION IMG BX SPEC US GUIDE*R*
1 series · 12 of 13 positions shown · non-contrast
Comparison: Previous exam(s).
COMPARISON: Previous exam(s).

Addendum:
CLINICAL DATA: 60-year-old female presenting for ultrasound-guided
biopsy of a right breast mass.

EXAM:
ULTRASOUND GUIDED RIGHT BREAST CORE NEEDLE BIOPSY

[Series 1: us breast bx w/ loc dev 1st lesion img bx spec us  · 0.06mm/px · 12 of 13 slices shown]
[im 1/13]
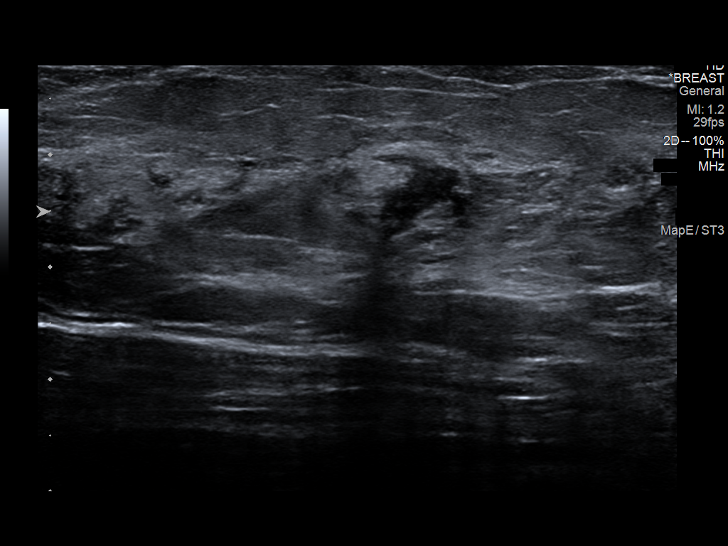
[im 2/13]
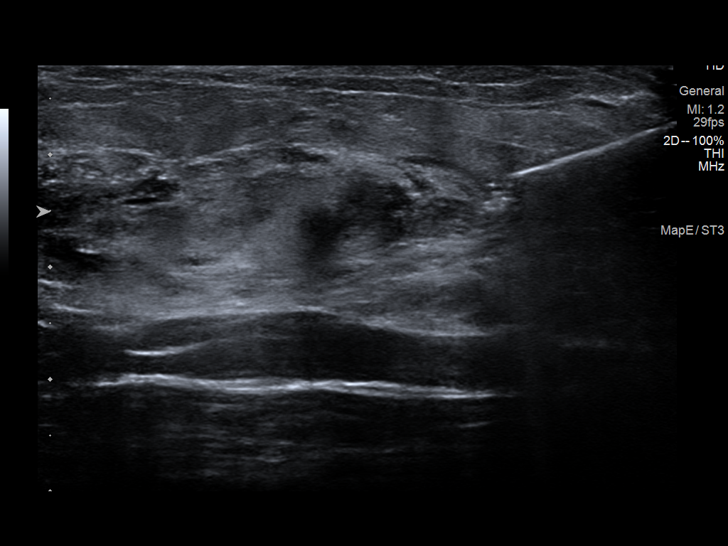
[im 3/13]
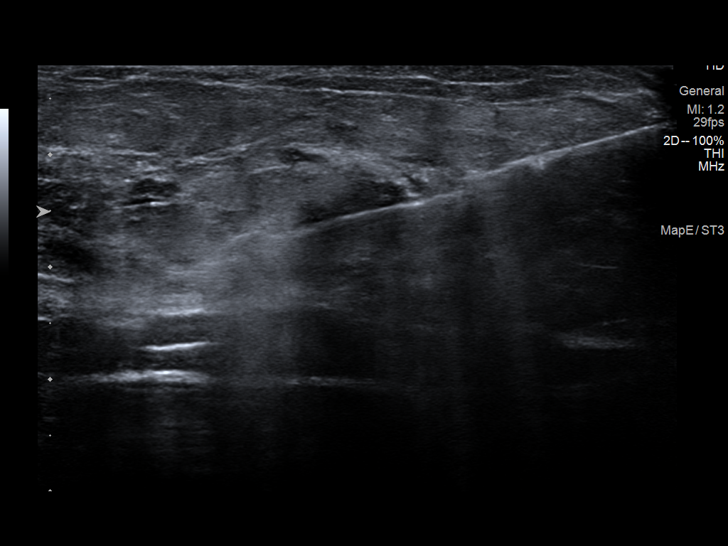
[im 4/13]
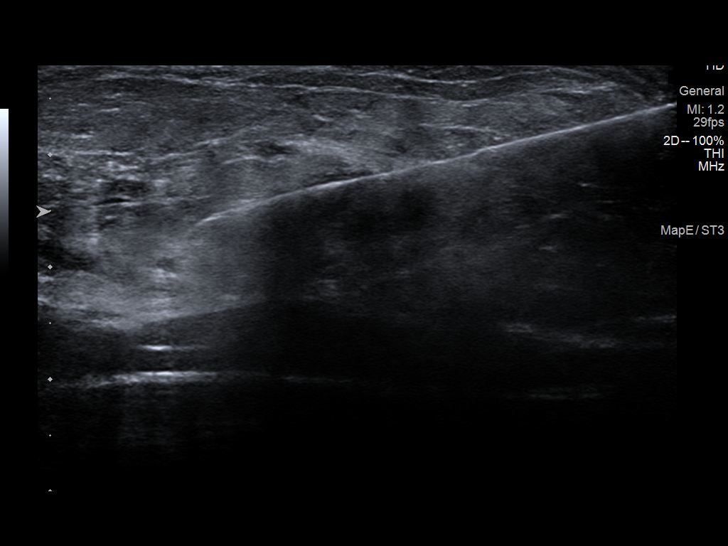
[im 5/13]
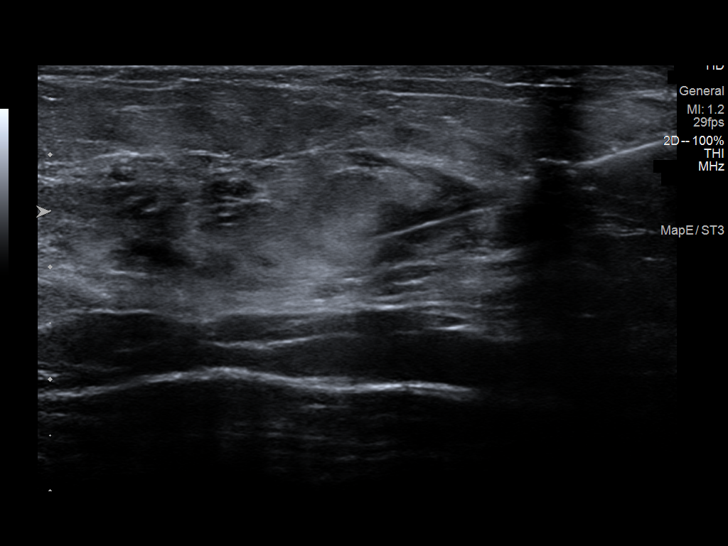
[im 6/13]
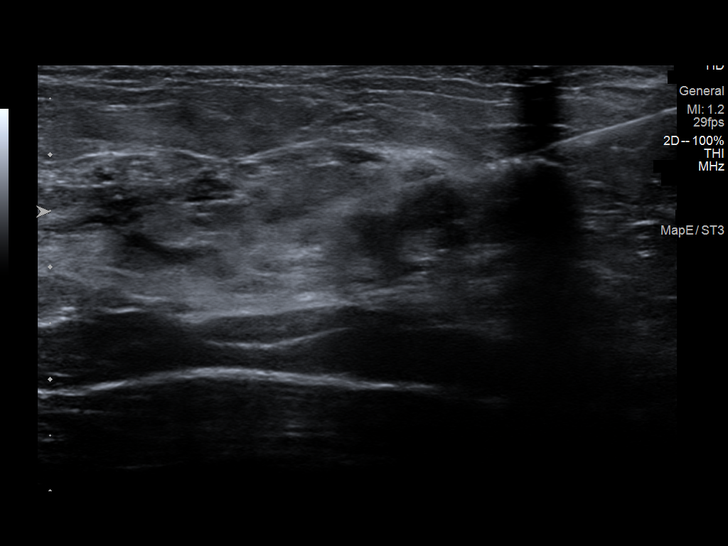
[im 8/13]
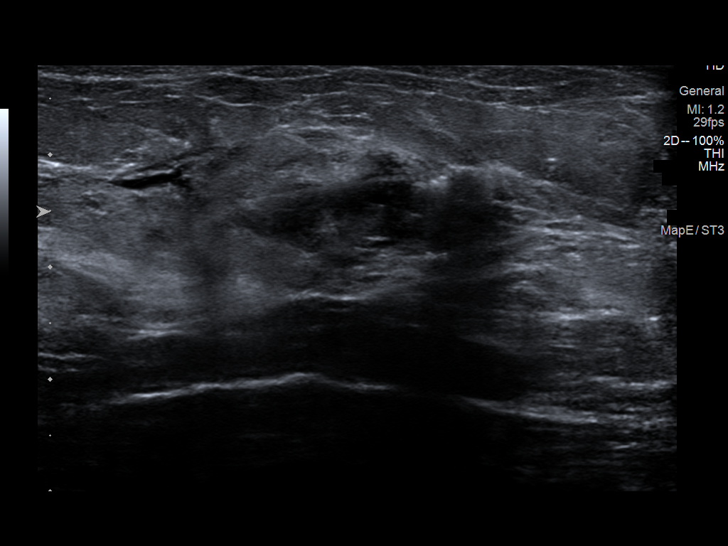
[im 9/13]
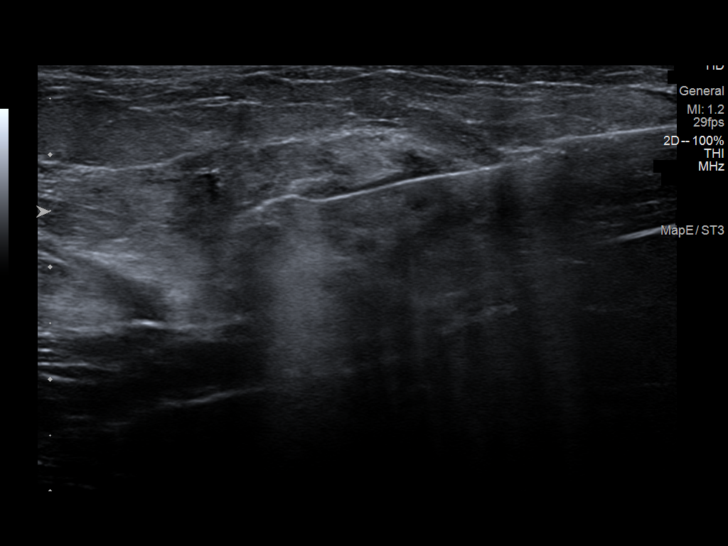
[im 10/13]
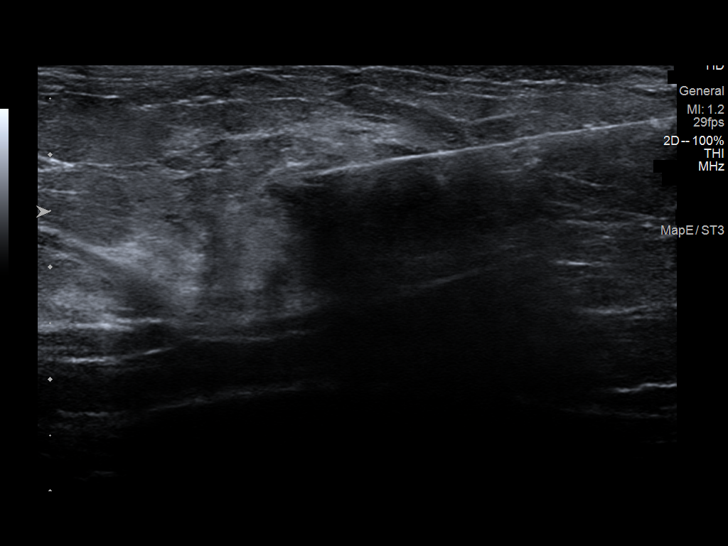
[im 11/13]
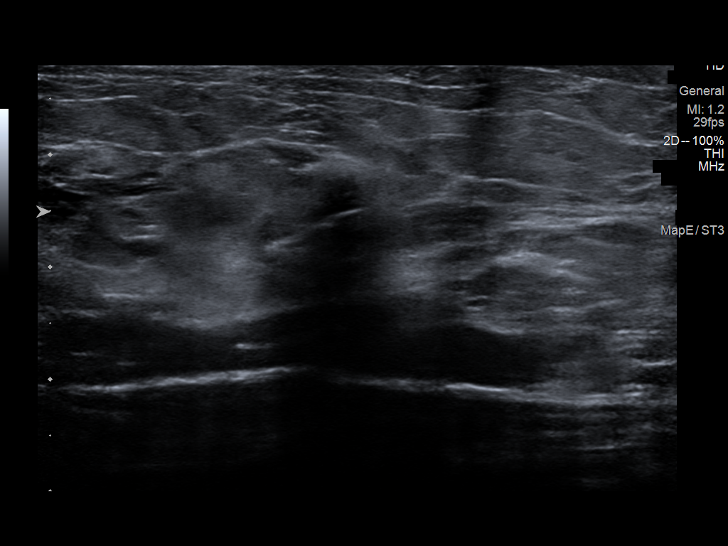
[im 12/13]
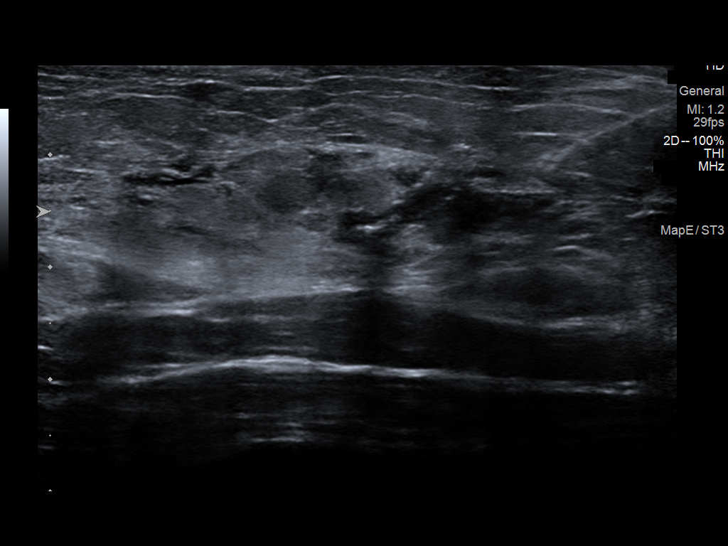
[im 13/13]
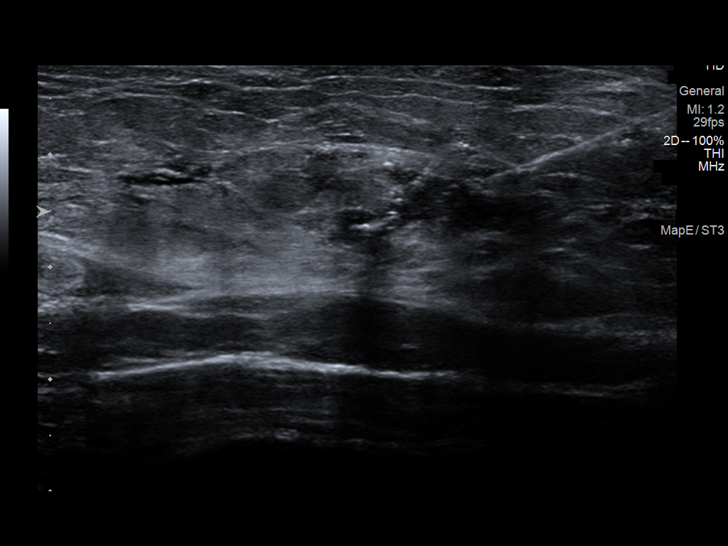

[12 of 13 positions shown; findings below may reference images not displayed]



Lesion quadrant: Upper outer quadrant

Using sterile technique and 1% Lidocaine as local anesthetic, under
direct ultrasound visualization, a 14 gauge Hellmut device was
used to perform biopsy of mass in the right breast at 9 o'clock, 2
cm from the nipple using an inferior approach. At the conclusion of
the procedure a ribbon shaped tissue marker clip was deployed into
the biopsy cavity. Follow up 2 view mammogram was performed and
dictated separately.
IMPRESSION: Ultrasound guided biopsy of the right breast mass at 9 o'clock. No
apparent complications.

ADDENDUM:
Pathology revealed COLUMNAR CELL HYPERPLASIA AND FIBROCYSTIC CHANGES
WITH CALCIFICATIONS of the Right breast, 9 o'clock 2 cmfn (ribbon
clip). This was found to be concordant by Dr. Diep Hafner.

Pathology results were discussed with the patient by telephone. The
patient reported doing well after the biopsy with minimal tenderness
at the site. Post biopsy instructions and care were reviewed and
questions were answered. The patient was encouraged to call The

The patient was instructed to return for annual screening
mammography in November 2021.

Pathology results reported by Geschwindt Palacio Moro, RN on 01/27/2021.



Lesion quadrant: Upper outer quadrant

Using sterile technique and 1% Lidocaine as local anesthetic, under
direct ultrasound visualization, a 14 gauge Hellmut device was
used to perform biopsy of mass in the right breast at 9 o'clock, 2
cm from the nipple using an inferior approach. At the conclusion of
the procedure a ribbon shaped tissue marker clip was deployed into
the biopsy cavity. Follow up 2 view mammogram was performed and
dictated separately.
IMPRESSION: Ultrasound guided biopsy of the right breast mass at 9 o'clock. No
apparent complications.

## 2023-02-18 ENCOUNTER — Other Ambulatory Visit (HOSPITAL_BASED_OUTPATIENT_CLINIC_OR_DEPARTMENT_OTHER): Payer: Self-pay

## 2023-02-20 ENCOUNTER — Other Ambulatory Visit: Payer: Self-pay

## 2023-02-23 ENCOUNTER — Other Ambulatory Visit (HOSPITAL_BASED_OUTPATIENT_CLINIC_OR_DEPARTMENT_OTHER): Payer: Self-pay

## 2023-02-23 MED ORDER — PROGESTERONE MICRONIZED 100 MG PO CAPS
100.0000 mg | ORAL_CAPSULE | Freq: Every day | ORAL | 1 refills | Status: AC
  Filled 2023-02-23: qty 90, 90d supply, fill #0
  Filled 2023-06-11: qty 90, 90d supply, fill #1

## 2023-02-23 MED ORDER — SPIRONOLACTONE 25 MG PO TABS
25.0000 mg | ORAL_TABLET | Freq: Every day | ORAL | 1 refills | Status: AC
  Filled 2023-02-23: qty 90, 90d supply, fill #0
  Filled 2023-05-19: qty 90, 90d supply, fill #1

## 2023-03-09 ENCOUNTER — Other Ambulatory Visit (HOSPITAL_BASED_OUTPATIENT_CLINIC_OR_DEPARTMENT_OTHER): Payer: Self-pay

## 2023-03-09 ENCOUNTER — Other Ambulatory Visit: Payer: Self-pay

## 2023-03-09 MED ORDER — OMEPRAZOLE 40 MG PO CPDR
40.0000 mg | DELAYED_RELEASE_CAPSULE | Freq: Every morning | ORAL | 0 refills | Status: DC
Start: 2023-03-09 — End: 2023-06-15
  Filled 2023-03-09 – 2023-03-23 (×3): qty 30, 30d supply, fill #0
  Filled 2023-04-17: qty 30, 30d supply, fill #1
  Filled 2023-05-16: qty 30, 30d supply, fill #2

## 2023-03-10 ENCOUNTER — Other Ambulatory Visit (HOSPITAL_BASED_OUTPATIENT_CLINIC_OR_DEPARTMENT_OTHER): Payer: Self-pay

## 2023-03-10 ENCOUNTER — Other Ambulatory Visit: Payer: Self-pay

## 2023-03-22 ENCOUNTER — Other Ambulatory Visit (HOSPITAL_BASED_OUTPATIENT_CLINIC_OR_DEPARTMENT_OTHER): Payer: Self-pay

## 2023-03-23 ENCOUNTER — Other Ambulatory Visit (HOSPITAL_BASED_OUTPATIENT_CLINIC_OR_DEPARTMENT_OTHER): Payer: Self-pay

## 2023-03-24 ENCOUNTER — Other Ambulatory Visit (HOSPITAL_BASED_OUTPATIENT_CLINIC_OR_DEPARTMENT_OTHER): Payer: Self-pay

## 2023-03-27 ENCOUNTER — Other Ambulatory Visit (HOSPITAL_BASED_OUTPATIENT_CLINIC_OR_DEPARTMENT_OTHER): Payer: Self-pay

## 2023-03-30 ENCOUNTER — Other Ambulatory Visit (HOSPITAL_BASED_OUTPATIENT_CLINIC_OR_DEPARTMENT_OTHER): Payer: Self-pay

## 2023-03-30 MED ORDER — BUPROPION HCL ER (XL) 150 MG PO TB24
150.0000 mg | ORAL_TABLET | Freq: Every morning | ORAL | 1 refills | Status: DC
  Filled 2023-03-30: qty 90, 90d supply, fill #0
  Filled 2023-06-26: qty 90, 90d supply, fill #1

## 2023-03-30 MED ORDER — BUPROPION HCL ER (XL) 300 MG PO TB24
300.0000 mg | ORAL_TABLET | Freq: Every morning | ORAL | 1 refills | Status: DC
  Filled 2023-03-30: qty 90, 90d supply, fill #0
  Filled 2023-06-26: qty 90, 90d supply, fill #1

## 2023-03-30 MED ORDER — VALSARTAN-HYDROCHLOROTHIAZIDE 80-12.5 MG PO TABS
1.0000 | ORAL_TABLET | Freq: Every morning | ORAL | 1 refills | Status: DC
  Filled 2023-03-30: qty 90, 90d supply, fill #0
  Filled 2023-06-21: qty 90, 90d supply, fill #1

## 2023-04-10 ENCOUNTER — Other Ambulatory Visit: Payer: Self-pay

## 2023-05-10 ENCOUNTER — Other Ambulatory Visit: Payer: Self-pay

## 2023-05-10 ENCOUNTER — Other Ambulatory Visit (HOSPITAL_BASED_OUTPATIENT_CLINIC_OR_DEPARTMENT_OTHER): Payer: Self-pay

## 2023-05-11 ENCOUNTER — Other Ambulatory Visit (HOSPITAL_BASED_OUTPATIENT_CLINIC_OR_DEPARTMENT_OTHER): Payer: Self-pay

## 2023-05-11 MED ORDER — CLONAZEPAM 0.5 MG PO TABS
0.5000 mg | ORAL_TABLET | Freq: Two times a day (BID) | ORAL | 0 refills | Status: AC
  Filled 2023-05-11: qty 60, 30d supply, fill #0

## 2023-05-16 ENCOUNTER — Other Ambulatory Visit (HOSPITAL_BASED_OUTPATIENT_CLINIC_OR_DEPARTMENT_OTHER): Payer: Self-pay

## 2023-05-16 MED ORDER — CLONAZEPAM 0.5 MG PO TABS
0.5000 mg | ORAL_TABLET | Freq: Two times a day (BID) | ORAL | 4 refills | Status: AC
  Filled 2023-05-16 – 2023-06-26 (×3): qty 60, 30d supply, fill #0
  Filled 2023-07-25: qty 60, 30d supply, fill #1

## 2023-05-19 ENCOUNTER — Other Ambulatory Visit (HOSPITAL_BASED_OUTPATIENT_CLINIC_OR_DEPARTMENT_OTHER): Payer: Self-pay

## 2023-05-19 MED ORDER — METOCLOPRAMIDE HCL 10 MG PO TABS
10.0000 mg | ORAL_TABLET | Freq: Four times a day (QID) | ORAL | 0 refills | Status: AC
  Filled 2023-05-19: qty 20, 5d supply, fill #0

## 2023-05-22 ENCOUNTER — Other Ambulatory Visit (HOSPITAL_BASED_OUTPATIENT_CLINIC_OR_DEPARTMENT_OTHER): Payer: Self-pay

## 2023-06-12 ENCOUNTER — Other Ambulatory Visit: Payer: Self-pay

## 2023-06-12 ENCOUNTER — Other Ambulatory Visit (HOSPITAL_BASED_OUTPATIENT_CLINIC_OR_DEPARTMENT_OTHER): Payer: Self-pay

## 2023-06-15 ENCOUNTER — Other Ambulatory Visit (HOSPITAL_BASED_OUTPATIENT_CLINIC_OR_DEPARTMENT_OTHER): Payer: Self-pay

## 2023-06-15 MED ORDER — OMEPRAZOLE 40 MG PO CPDR
40.0000 mg | DELAYED_RELEASE_CAPSULE | Freq: Every morning | ORAL | 0 refills | Status: DC
  Filled 2023-06-15: qty 30, 30d supply, fill #0
  Filled 2023-07-10: qty 30, 30d supply, fill #1

## 2023-06-21 ENCOUNTER — Other Ambulatory Visit: Payer: Self-pay

## 2023-06-21 ENCOUNTER — Other Ambulatory Visit (HOSPITAL_BASED_OUTPATIENT_CLINIC_OR_DEPARTMENT_OTHER): Payer: Self-pay

## 2023-06-26 ENCOUNTER — Other Ambulatory Visit (HOSPITAL_BASED_OUTPATIENT_CLINIC_OR_DEPARTMENT_OTHER): Payer: Self-pay

## 2023-06-26 ENCOUNTER — Other Ambulatory Visit: Payer: Self-pay

## 2023-06-28 ENCOUNTER — Other Ambulatory Visit: Payer: Self-pay

## 2023-07-05 ENCOUNTER — Other Ambulatory Visit (HOSPITAL_BASED_OUTPATIENT_CLINIC_OR_DEPARTMENT_OTHER): Payer: Self-pay

## 2023-07-10 ENCOUNTER — Other Ambulatory Visit (HOSPITAL_BASED_OUTPATIENT_CLINIC_OR_DEPARTMENT_OTHER): Payer: Self-pay

## 2023-07-26 ENCOUNTER — Other Ambulatory Visit: Payer: Self-pay

## 2023-08-11 ENCOUNTER — Other Ambulatory Visit (HOSPITAL_BASED_OUTPATIENT_CLINIC_OR_DEPARTMENT_OTHER): Payer: Self-pay

## 2023-08-16 ENCOUNTER — Other Ambulatory Visit (HOSPITAL_BASED_OUTPATIENT_CLINIC_OR_DEPARTMENT_OTHER): Payer: Self-pay

## 2023-09-05 ENCOUNTER — Other Ambulatory Visit (HOSPITAL_BASED_OUTPATIENT_CLINIC_OR_DEPARTMENT_OTHER): Payer: Self-pay

## 2023-09-05 MED ORDER — CLONAZEPAM 0.5 MG PO TABS
0.5000 mg | ORAL_TABLET | Freq: Two times a day (BID) | ORAL | 2 refills | Status: AC
  Filled 2023-09-05: qty 60, 30d supply, fill #0
  Filled 2023-10-18: qty 60, 30d supply, fill #1

## 2023-09-06 ENCOUNTER — Other Ambulatory Visit (HOSPITAL_BASED_OUTPATIENT_CLINIC_OR_DEPARTMENT_OTHER): Payer: Self-pay

## 2023-09-19 ENCOUNTER — Other Ambulatory Visit (HOSPITAL_BASED_OUTPATIENT_CLINIC_OR_DEPARTMENT_OTHER): Payer: Self-pay

## 2023-09-19 MED ORDER — BUPROPION HCL ER (XL) 150 MG PO TB24
150.0000 mg | ORAL_TABLET | Freq: Every morning | ORAL | 1 refills | Status: DC
Start: 2023-09-19 — End: 2024-03-11
  Filled 2023-09-19: qty 90, 90d supply, fill #0
  Filled 2023-12-18: qty 90, 90d supply, fill #1

## 2023-09-19 MED ORDER — VALSARTAN-HYDROCHLOROTHIAZIDE 80-12.5 MG PO TABS
1.0000 | ORAL_TABLET | Freq: Every morning | ORAL | 1 refills | Status: DC
  Filled 2023-09-19: qty 90, 90d supply, fill #0
  Filled 2023-12-18: qty 90, 90d supply, fill #1

## 2023-09-19 MED ORDER — BUPROPION HCL ER (XL) 300 MG PO TB24
300.0000 mg | ORAL_TABLET | Freq: Every morning | ORAL | 1 refills | Status: DC
  Filled 2023-09-19: qty 90, 90d supply, fill #0
  Filled 2023-12-18: qty 90, 90d supply, fill #1

## 2023-09-27 ENCOUNTER — Other Ambulatory Visit (HOSPITAL_BASED_OUTPATIENT_CLINIC_OR_DEPARTMENT_OTHER): Payer: Self-pay

## 2023-09-27 MED ORDER — CITALOPRAM HYDROBROMIDE 20 MG PO TABS
20.0000 mg | ORAL_TABLET | Freq: Every day | ORAL | 1 refills | Status: DC
Start: 2023-09-27 — End: 2024-03-11
  Filled 2023-09-27: qty 90, 90d supply, fill #0
  Filled 2023-12-25: qty 90, 90d supply, fill #1

## 2023-09-28 ENCOUNTER — Other Ambulatory Visit: Payer: Self-pay

## 2023-09-29 ENCOUNTER — Other Ambulatory Visit (HOSPITAL_BASED_OUTPATIENT_CLINIC_OR_DEPARTMENT_OTHER): Payer: Self-pay

## 2023-09-29 MED ORDER — ESTRADIOL 0.1 MG/GM VA CREA
0.5000 | TOPICAL_CREAM | VAGINAL | 2 refills | Status: AC
  Filled 2023-09-29: qty 42.5, 90d supply, fill #0
  Filled 2023-12-21: qty 42.5, 90d supply, fill #1

## 2023-10-18 ENCOUNTER — Other Ambulatory Visit (HOSPITAL_BASED_OUTPATIENT_CLINIC_OR_DEPARTMENT_OTHER): Payer: Self-pay

## 2023-10-20 ENCOUNTER — Other Ambulatory Visit (HOSPITAL_BASED_OUTPATIENT_CLINIC_OR_DEPARTMENT_OTHER): Payer: Self-pay

## 2023-10-23 ENCOUNTER — Other Ambulatory Visit (HOSPITAL_BASED_OUTPATIENT_CLINIC_OR_DEPARTMENT_OTHER): Payer: Self-pay

## 2023-10-24 ENCOUNTER — Other Ambulatory Visit: Payer: Self-pay

## 2023-10-24 ENCOUNTER — Other Ambulatory Visit (HOSPITAL_BASED_OUTPATIENT_CLINIC_OR_DEPARTMENT_OTHER): Payer: Self-pay

## 2023-10-24 MED ORDER — VALACYCLOVIR HCL 1 G PO TABS
1000.0000 mg | ORAL_TABLET | Freq: Every day | ORAL | 3 refills | Status: AC
  Filled 2023-10-24: qty 30, 30d supply, fill #0
  Filled 2023-11-24: qty 30, 30d supply, fill #1
  Filled 2023-12-22: qty 30, 30d supply, fill #2
  Filled 2024-01-20: qty 30, 30d supply, fill #3
  Filled 2024-03-15: qty 30, 30d supply, fill #4
  Filled 2024-04-01 – 2024-04-15 (×2): qty 30, 30d supply, fill #5
  Filled 2024-06-01: qty 30, 30d supply, fill #6
  Filled 2024-06-27: qty 30, 30d supply, fill #7

## 2023-10-26 ENCOUNTER — Other Ambulatory Visit (HOSPITAL_BASED_OUTPATIENT_CLINIC_OR_DEPARTMENT_OTHER): Payer: Self-pay

## 2023-10-26 MED ORDER — FAMOTIDINE 40 MG PO TABS
40.0000 mg | ORAL_TABLET | Freq: Every day | ORAL | 3 refills | Status: DC
  Filled 2023-10-26 – 2023-10-30 (×2): qty 90, 90d supply, fill #0

## 2023-10-27 ENCOUNTER — Other Ambulatory Visit (HOSPITAL_BASED_OUTPATIENT_CLINIC_OR_DEPARTMENT_OTHER): Payer: Self-pay

## 2023-10-30 ENCOUNTER — Other Ambulatory Visit (HOSPITAL_BASED_OUTPATIENT_CLINIC_OR_DEPARTMENT_OTHER): Payer: Self-pay

## 2023-11-01 ENCOUNTER — Other Ambulatory Visit (HOSPITAL_BASED_OUTPATIENT_CLINIC_OR_DEPARTMENT_OTHER): Payer: Self-pay

## 2023-11-15 ENCOUNTER — Other Ambulatory Visit (HOSPITAL_BASED_OUTPATIENT_CLINIC_OR_DEPARTMENT_OTHER): Payer: Self-pay

## 2023-11-15 MED ORDER — DICLOFENAC SODIUM 75 MG PO TBEC
75.0000 mg | DELAYED_RELEASE_TABLET | Freq: Two times a day (BID) | ORAL | 1 refills | Status: DC
  Filled 2023-11-15: qty 60, 30d supply, fill #0
  Filled 2023-12-06: qty 60, 30d supply, fill #1

## 2023-11-23 ENCOUNTER — Other Ambulatory Visit (HOSPITAL_BASED_OUTPATIENT_CLINIC_OR_DEPARTMENT_OTHER): Payer: Self-pay

## 2023-11-23 MED ORDER — CLONAZEPAM 0.5 MG PO TABS
ORAL_TABLET | ORAL | 2 refills | Status: DC
  Filled 2023-11-23: qty 60, 30d supply, fill #0
  Filled 2023-12-22: qty 60, 30d supply, fill #1
  Filled 2024-02-06: qty 60, 30d supply, fill #2

## 2023-12-06 ENCOUNTER — Other Ambulatory Visit (HOSPITAL_BASED_OUTPATIENT_CLINIC_OR_DEPARTMENT_OTHER): Payer: Self-pay

## 2023-12-18 ENCOUNTER — Other Ambulatory Visit (HOSPITAL_BASED_OUTPATIENT_CLINIC_OR_DEPARTMENT_OTHER): Payer: Self-pay

## 2023-12-18 MED ORDER — OMEPRAZOLE 40 MG PO CPDR
40.0000 mg | DELAYED_RELEASE_CAPSULE | Freq: Every day | ORAL | 3 refills | Status: DC
  Filled 2023-12-18 – 2023-12-25 (×2): qty 90, 90d supply, fill #0

## 2023-12-21 ENCOUNTER — Other Ambulatory Visit (HOSPITAL_BASED_OUTPATIENT_CLINIC_OR_DEPARTMENT_OTHER): Payer: Self-pay

## 2023-12-22 ENCOUNTER — Other Ambulatory Visit: Payer: Self-pay

## 2023-12-22 ENCOUNTER — Other Ambulatory Visit (HOSPITAL_BASED_OUTPATIENT_CLINIC_OR_DEPARTMENT_OTHER): Payer: Self-pay

## 2023-12-22 MED ORDER — DICLOFENAC SODIUM 75 MG PO TBEC
75.0000 mg | DELAYED_RELEASE_TABLET | Freq: Two times a day (BID) | ORAL | 1 refills | Status: DC
  Filled 2023-12-22 – 2023-12-29 (×3): qty 60, 30d supply, fill #0
  Filled 2024-01-20: qty 60, 30d supply, fill #1

## 2023-12-25 ENCOUNTER — Other Ambulatory Visit (HOSPITAL_BASED_OUTPATIENT_CLINIC_OR_DEPARTMENT_OTHER): Payer: Self-pay

## 2023-12-29 ENCOUNTER — Other Ambulatory Visit (HOSPITAL_BASED_OUTPATIENT_CLINIC_OR_DEPARTMENT_OTHER): Payer: Self-pay

## 2024-01-22 ENCOUNTER — Other Ambulatory Visit (HOSPITAL_BASED_OUTPATIENT_CLINIC_OR_DEPARTMENT_OTHER): Payer: Self-pay

## 2024-02-06 ENCOUNTER — Other Ambulatory Visit (HOSPITAL_BASED_OUTPATIENT_CLINIC_OR_DEPARTMENT_OTHER): Payer: Self-pay

## 2024-02-06 MED ORDER — HYDROCODONE-ACETAMINOPHEN 5-325 MG PO TABS
1.0000 | ORAL_TABLET | Freq: Four times a day (QID) | ORAL | 0 refills | Status: DC | PRN
  Filled 2024-02-06: qty 20, 5d supply, fill #0

## 2024-02-06 MED ORDER — ONDANSETRON 4 MG PO TBDP
4.0000 mg | ORAL_TABLET | Freq: Three times a day (TID) | ORAL | 0 refills | Status: AC | PRN
  Filled 2024-02-06: qty 20, 7d supply, fill #0

## 2024-02-13 ENCOUNTER — Other Ambulatory Visit (HOSPITAL_BASED_OUTPATIENT_CLINIC_OR_DEPARTMENT_OTHER): Payer: Self-pay

## 2024-02-13 MED ORDER — OMEPRAZOLE 40 MG PO CPDR
40.0000 mg | DELAYED_RELEASE_CAPSULE | Freq: Every morning | ORAL | 3 refills | Status: AC
  Filled 2024-02-13: qty 90, 90d supply, fill #0
  Filled 2024-06-01 – 2024-07-01 (×3): qty 90, 90d supply, fill #1

## 2024-02-14 ENCOUNTER — Other Ambulatory Visit (HOSPITAL_BASED_OUTPATIENT_CLINIC_OR_DEPARTMENT_OTHER): Payer: Self-pay

## 2024-02-28 ENCOUNTER — Other Ambulatory Visit (HOSPITAL_BASED_OUTPATIENT_CLINIC_OR_DEPARTMENT_OTHER): Payer: Self-pay

## 2024-03-07 ENCOUNTER — Other Ambulatory Visit (HOSPITAL_BASED_OUTPATIENT_CLINIC_OR_DEPARTMENT_OTHER): Payer: Self-pay

## 2024-03-07 MED ORDER — CLONAZEPAM 0.5 MG PO TABS
0.5000 mg | ORAL_TABLET | Freq: Two times a day (BID) | ORAL | 2 refills | Status: AC
  Filled 2024-03-07: qty 60, 30d supply, fill #0

## 2024-03-11 ENCOUNTER — Other Ambulatory Visit: Payer: Self-pay

## 2024-03-11 ENCOUNTER — Other Ambulatory Visit (HOSPITAL_BASED_OUTPATIENT_CLINIC_OR_DEPARTMENT_OTHER): Payer: Self-pay

## 2024-03-11 MED ORDER — VALSARTAN-HYDROCHLOROTHIAZIDE 80-12.5 MG PO TABS
1.0000 | ORAL_TABLET | ORAL | 3 refills | Status: DC
  Filled 2024-03-11: qty 90, 90d supply, fill #0

## 2024-03-11 MED ORDER — CITALOPRAM HYDROBROMIDE 20 MG PO TABS
20.0000 mg | ORAL_TABLET | Freq: Every day | ORAL | 3 refills | Status: AC
  Filled 2024-03-11: qty 90, 90d supply, fill #0
  Filled 2024-06-19: qty 90, 90d supply, fill #1
  Filled 2024-06-27: qty 90, 90d supply, fill #2

## 2024-03-11 MED ORDER — BUPROPION HCL ER (XL) 300 MG PO TB24
300.0000 mg | ORAL_TABLET | Freq: Every morning | ORAL | 3 refills | Status: AC
  Filled 2024-03-11: qty 90, 90d supply, fill #0
  Filled 2024-06-11: qty 90, 90d supply, fill #1

## 2024-03-11 MED ORDER — ESTRADIOL 0.01 % VA CREA
0.5000 | TOPICAL_CREAM | VAGINAL | 2 refills | Status: AC
  Filled 2024-03-11: qty 42.5, 90d supply, fill #0
  Filled 2024-06-11: qty 42.5, 90d supply, fill #1

## 2024-03-11 MED ORDER — BUPROPION HCL ER (XL) 150 MG PO TB24
150.0000 mg | ORAL_TABLET | Freq: Every morning | ORAL | 3 refills | Status: AC
  Filled 2024-03-11: qty 90, 90d supply, fill #0
  Filled 2024-06-12: qty 90, 90d supply, fill #1

## 2024-03-20 ENCOUNTER — Other Ambulatory Visit (HOSPITAL_BASED_OUTPATIENT_CLINIC_OR_DEPARTMENT_OTHER): Payer: Self-pay

## 2024-03-21 ENCOUNTER — Other Ambulatory Visit (HOSPITAL_BASED_OUTPATIENT_CLINIC_OR_DEPARTMENT_OTHER): Payer: Self-pay

## 2024-03-31 ENCOUNTER — Other Ambulatory Visit (HOSPITAL_BASED_OUTPATIENT_CLINIC_OR_DEPARTMENT_OTHER): Payer: Self-pay

## 2024-03-31 MED ORDER — CIPROFLOXACIN HCL 500 MG PO TABS
500.0000 mg | ORAL_TABLET | Freq: Two times a day (BID) | ORAL | 0 refills | Status: AC
  Filled 2024-03-31: qty 10, 5d supply, fill #0

## 2024-03-31 MED ORDER — METRONIDAZOLE 500 MG PO TABS
500.0000 mg | ORAL_TABLET | Freq: Three times a day (TID) | ORAL | 0 refills | Status: AC
  Filled 2024-03-31: qty 15, 5d supply, fill #0

## 2024-04-01 ENCOUNTER — Other Ambulatory Visit (HOSPITAL_BASED_OUTPATIENT_CLINIC_OR_DEPARTMENT_OTHER): Payer: Self-pay

## 2024-04-01 MED ORDER — DICLOFENAC SODIUM 75 MG PO TBEC
75.0000 mg | DELAYED_RELEASE_TABLET | Freq: Two times a day (BID) | ORAL | 1 refills | Status: DC
  Filled 2024-04-01: qty 60, 30d supply, fill #0
  Filled 2024-04-15: qty 60, 30d supply, fill #1

## 2024-04-02 ENCOUNTER — Other Ambulatory Visit: Payer: Self-pay

## 2024-04-02 ENCOUNTER — Other Ambulatory Visit (HOSPITAL_BASED_OUTPATIENT_CLINIC_OR_DEPARTMENT_OTHER): Payer: Self-pay

## 2024-04-02 MED ORDER — LOPERAMIDE HCL 2 MG PO CAPS
2.0000 mg | ORAL_CAPSULE | Freq: Four times a day (QID) | ORAL | 0 refills | Status: AC | PRN
  Filled 2024-04-02: qty 30, 8d supply, fill #0

## 2024-04-15 ENCOUNTER — Other Ambulatory Visit (HOSPITAL_BASED_OUTPATIENT_CLINIC_OR_DEPARTMENT_OTHER): Payer: Self-pay

## 2024-04-15 ENCOUNTER — Other Ambulatory Visit: Payer: Self-pay

## 2024-04-16 ENCOUNTER — Other Ambulatory Visit: Payer: Self-pay

## 2024-04-16 ENCOUNTER — Other Ambulatory Visit (HOSPITAL_BASED_OUTPATIENT_CLINIC_OR_DEPARTMENT_OTHER): Payer: Self-pay

## 2024-04-16 MED ORDER — OMEPRAZOLE 40 MG PO CPDR
40.0000 mg | DELAYED_RELEASE_CAPSULE | Freq: Two times a day (BID) | ORAL | 3 refills | Status: AC
  Filled 2024-04-16: qty 180, 90d supply, fill #0

## 2024-04-17 ENCOUNTER — Other Ambulatory Visit (HOSPITAL_BASED_OUTPATIENT_CLINIC_OR_DEPARTMENT_OTHER): Payer: Self-pay

## 2024-04-17 ENCOUNTER — Other Ambulatory Visit: Payer: Self-pay

## 2024-04-17 MED ORDER — ONDANSETRON HCL 4 MG PO TABS
4.0000 mg | ORAL_TABLET | Freq: Three times a day (TID) | ORAL | 0 refills | Status: DC | PRN
  Filled 2024-04-17: qty 20, 7d supply, fill #0

## 2024-05-09 ENCOUNTER — Other Ambulatory Visit (HOSPITAL_BASED_OUTPATIENT_CLINIC_OR_DEPARTMENT_OTHER): Payer: Self-pay

## 2024-05-09 MED ORDER — CLONAZEPAM 0.5 MG PO TABS
0.5000 mg | ORAL_TABLET | Freq: Every day | ORAL | 2 refills | Status: AC
  Filled 2024-05-09: qty 30, 30d supply, fill #0
  Filled 2024-06-28: qty 30, 30d supply, fill #1

## 2024-05-15 ENCOUNTER — Other Ambulatory Visit (HOSPITAL_BASED_OUTPATIENT_CLINIC_OR_DEPARTMENT_OTHER): Payer: Self-pay

## 2024-05-15 MED ORDER — ONDANSETRON HCL 4 MG PO TABS
4.0000 mg | ORAL_TABLET | Freq: Three times a day (TID) | ORAL | 0 refills | Status: AC | PRN
  Filled 2024-05-15: qty 20, 7d supply, fill #0

## 2024-06-03 ENCOUNTER — Other Ambulatory Visit: Payer: Self-pay

## 2024-06-03 ENCOUNTER — Other Ambulatory Visit (HOSPITAL_BASED_OUTPATIENT_CLINIC_OR_DEPARTMENT_OTHER): Payer: Self-pay

## 2024-06-10 ENCOUNTER — Other Ambulatory Visit (HOSPITAL_BASED_OUTPATIENT_CLINIC_OR_DEPARTMENT_OTHER): Payer: Self-pay

## 2024-06-27 ENCOUNTER — Other Ambulatory Visit (HOSPITAL_BASED_OUTPATIENT_CLINIC_OR_DEPARTMENT_OTHER): Payer: Self-pay

## 2024-06-27 ENCOUNTER — Other Ambulatory Visit: Payer: Self-pay

## 2024-06-28 ENCOUNTER — Other Ambulatory Visit (HOSPITAL_BASED_OUTPATIENT_CLINIC_OR_DEPARTMENT_OTHER): Payer: Self-pay

## 2024-07-02 ENCOUNTER — Other Ambulatory Visit (HOSPITAL_BASED_OUTPATIENT_CLINIC_OR_DEPARTMENT_OTHER): Payer: Self-pay
